# Patient Record
Sex: Male | Born: 1940 | Smoking: Former smoker
Health system: Southern US, Community
[De-identification: ages and names within clinical notes are randomized; demographics above are authoritative.]

## PROBLEM LIST (undated history)

## (undated) DIAGNOSIS — N400 Enlarged prostate without lower urinary tract symptoms: Secondary | ICD-10-CM

## (undated) DIAGNOSIS — E039 Hypothyroidism, unspecified: Secondary | ICD-10-CM

## (undated) DIAGNOSIS — I639 Cerebral infarction, unspecified: Secondary | ICD-10-CM

## (undated) DIAGNOSIS — E119 Type 2 diabetes mellitus without complications: Secondary | ICD-10-CM

## (undated) DIAGNOSIS — I509 Heart failure, unspecified: Secondary | ICD-10-CM

## (undated) DIAGNOSIS — R569 Unspecified convulsions: Secondary | ICD-10-CM

## (undated) DIAGNOSIS — J9620 Acute and chronic respiratory failure, unspecified whether with hypoxia or hypercapnia: Secondary | ICD-10-CM

## (undated) DIAGNOSIS — F419 Anxiety disorder, unspecified: Secondary | ICD-10-CM

## (undated) DIAGNOSIS — J189 Pneumonia, unspecified organism: Secondary | ICD-10-CM

## (undated) DIAGNOSIS — F319 Bipolar disorder, unspecified: Secondary | ICD-10-CM

## (undated) DIAGNOSIS — M109 Gout, unspecified: Secondary | ICD-10-CM

## (undated) DIAGNOSIS — I10 Essential (primary) hypertension: Secondary | ICD-10-CM

## (undated) DIAGNOSIS — H9190 Unspecified hearing loss, unspecified ear: Secondary | ICD-10-CM

## (undated) DIAGNOSIS — I4891 Unspecified atrial fibrillation: Secondary | ICD-10-CM

## (undated) DIAGNOSIS — J449 Chronic obstructive pulmonary disease, unspecified: Secondary | ICD-10-CM

## (undated) HISTORY — PX: CHOLECYSTECTOMY: SHX55

## (undated) HISTORY — DX: Cerebral infarction, unspecified: I63.9

## (undated) HISTORY — PX: LUMBAR LAMINECTOMY: SHX95

## (undated) HISTORY — PX: NEPHROLITHOTOMY: SUR881

---

## 2020-03-20 LAB — HEMOGLOBIN A1C: Hemoglobin A1C: 8.3

## 2020-03-20 LAB — TSH: TSH: 1.7 (ref 0.41–5.90)

## 2020-03-20 LAB — VITAMIN D 25 HYDROXY (VIT D DEFICIENCY, FRACTURES): Vit D, 25-Hydroxy: 44.6

## 2020-04-19 ENCOUNTER — Emergency Department (HOSPITAL_COMMUNITY): Payer: Medicare Other

## 2020-04-19 ENCOUNTER — Emergency Department (HOSPITAL_COMMUNITY)
Admission: EM | Admit: 2020-04-19 | Discharge: 2020-04-19 | Disposition: A | Discharge status: Home / Self Care | Payer: Medicare Other | Attending: Emergency Medicine | Admitting: Emergency Medicine

## 2020-04-19 ENCOUNTER — Encounter (HOSPITAL_COMMUNITY): Payer: Self-pay | Admitting: *Deleted

## 2020-04-19 DIAGNOSIS — I509 Heart failure, unspecified: Secondary | ICD-10-CM | POA: Insufficient documentation

## 2020-04-19 DIAGNOSIS — Z7901 Long term (current) use of anticoagulants: Secondary | ICD-10-CM | POA: Diagnosis not present

## 2020-04-19 DIAGNOSIS — I110 Hypertensive heart disease with heart failure: Secondary | ICD-10-CM | POA: Diagnosis not present

## 2020-04-19 DIAGNOSIS — Z7982 Long term (current) use of aspirin: Secondary | ICD-10-CM | POA: Diagnosis not present

## 2020-04-19 DIAGNOSIS — Z7951 Long term (current) use of inhaled steroids: Secondary | ICD-10-CM | POA: Insufficient documentation

## 2020-04-19 DIAGNOSIS — I4891 Unspecified atrial fibrillation: Secondary | ICD-10-CM | POA: Diagnosis not present

## 2020-04-19 DIAGNOSIS — Z794 Long term (current) use of insulin: Secondary | ICD-10-CM | POA: Insufficient documentation

## 2020-04-19 DIAGNOSIS — R0602 Shortness of breath: Secondary | ICD-10-CM | POA: Diagnosis present

## 2020-04-19 DIAGNOSIS — E119 Type 2 diabetes mellitus without complications: Secondary | ICD-10-CM | POA: Diagnosis not present

## 2020-04-19 DIAGNOSIS — R079 Chest pain, unspecified: Secondary | ICD-10-CM | POA: Diagnosis not present

## 2020-04-19 DIAGNOSIS — J449 Chronic obstructive pulmonary disease, unspecified: Secondary | ICD-10-CM | POA: Diagnosis not present

## 2020-04-19 DIAGNOSIS — Z79899 Other long term (current) drug therapy: Secondary | ICD-10-CM | POA: Insufficient documentation

## 2020-04-19 DIAGNOSIS — E039 Hypothyroidism, unspecified: Secondary | ICD-10-CM | POA: Diagnosis not present

## 2020-04-19 HISTORY — DX: Acute and chronic respiratory failure, unspecified whether with hypoxia or hypercapnia: J96.20

## 2020-04-19 HISTORY — DX: Pneumonia, unspecified organism: J18.9

## 2020-04-19 HISTORY — DX: Unspecified atrial fibrillation: I48.91

## 2020-04-19 HISTORY — DX: Chronic obstructive pulmonary disease, unspecified: J44.9

## 2020-04-19 HISTORY — DX: Hypothyroidism, unspecified: E03.9

## 2020-04-19 HISTORY — DX: Type 2 diabetes mellitus without complications: E11.9

## 2020-04-19 HISTORY — DX: Heart failure, unspecified: I50.9

## 2020-04-19 HISTORY — DX: Essential (primary) hypertension: I10

## 2020-04-19 LAB — URINALYSIS, ROUTINE W REFLEX MICROSCOPIC
Bilirubin Urine: NEGATIVE
Glucose, UA: NEGATIVE mg/dL
Hgb urine dipstick: NEGATIVE
Ketones, ur: NEGATIVE mg/dL
Leukocytes,Ua: NEGATIVE
Nitrite: NEGATIVE
Protein, ur: NEGATIVE mg/dL
Specific Gravity, Urine: 1.008 (ref 1.005–1.030)
pH: 7 (ref 5.0–8.0)

## 2020-04-19 LAB — CBC
HCT: 44.8 % (ref 39.0–52.0)
Hemoglobin: 14.7 g/dL (ref 13.0–17.0)
MCH: 30.2 pg (ref 26.0–34.0)
MCHC: 32.8 g/dL (ref 30.0–36.0)
MCV: 92 fL (ref 80.0–100.0)
Platelets: 88 10*3/uL — ABNORMAL LOW (ref 150–400)
RBC: 4.87 MIL/uL (ref 4.22–5.81)
RDW: 13.9 % (ref 11.5–15.5)
WBC: 4.4 10*3/uL (ref 4.0–10.5)
nRBC: 0 % (ref 0.0–0.2)

## 2020-04-19 LAB — BASIC METABOLIC PANEL
Anion gap: 10 (ref 5–15)
BUN: 14 mg/dL (ref 8–23)
CO2: 30 mmol/L (ref 22–32)
Calcium: 9.1 mg/dL (ref 8.9–10.3)
Chloride: 98 mmol/L (ref 98–111)
Creatinine, Ser: 0.82 mg/dL (ref 0.61–1.24)
GFR calc Af Amer: >60 mL/min (ref >60)
GFR calc non Af Amer: >60 mL/min (ref >60)
Glucose, Bld: 165 mg/dL — ABNORMAL HIGH (ref 70–99)
Potassium: 3.8 mmol/L (ref 3.5–5.1)
Sodium: 138 mmol/L (ref 135–145)

## 2020-04-19 LAB — BRAIN NATRIURETIC PEPTIDE: B Natriuretic Peptide: 61 pg/mL (ref 0.0–100.0)

## 2020-04-19 LAB — TROPONIN I (HIGH SENSITIVITY)
Troponin I (High Sensitivity): 16 ng/L (ref <18)
Troponin I (High Sensitivity): 17 ng/L (ref <18)

## 2020-04-19 MED ORDER — ASPIRIN 81 MG PO CHEW
324.0000 mg | CHEWABLE_TABLET | Freq: Once | ORAL | Status: AC
  Administered 2020-04-19: 324 mg via ORAL
  Filled 2020-04-19: qty 4

## 2020-04-19 MED ORDER — SODIUM CHLORIDE 0.9% FLUSH: 3.0000 mL | Freq: Once | INTRAVENOUS | Status: DC

## 2020-04-19 NOTE — Discharge Instructions (Addendum)
Your exam, lab tests, xray and ekg are all stable tonight with no source of your symptoms found.  Since your symptoms are resolved, you may return home tonight. However,  I recommend close followup with your primary doctor for a recheck next week, especially if you have any new or return of these symptoms.  I also recommend recheck here if your symptoms worsen and you are unable to get in with your doctor.

## 2020-04-19 NOTE — ED Notes (Signed)
Pt incontinent of urine. Pt cleaned, linens changed and primo fit placed. Pt repositioned.

## 2020-04-19 NOTE — ED Triage Notes (Signed)
Pt reports he has a hx of having 2 heart attacks in the past

## 2020-04-19 NOTE — ED Notes (Signed)
Patient transported to X-ray 

## 2020-04-19 NOTE — ED Notes (Signed)
Pt put into gown due to wet pants and a clean brief put onto pt.

## 2020-04-19 NOTE — ED Notes (Signed)
Pt is on male purewick. Pt told to call when urine sample is ready.

## 2020-04-19 NOTE — ED Provider Notes (Signed)
[facility] EMERGENCY DEPARTMENT Provider Note   CSN: [id number] Arrival date & time: 04/19/20  1717     History Chief Complaint  Patient presents with  . Shortness of Breath    John Kirk is a 79 y.o. male with a history of respiratory failure, atrial fibrillation, CHF, COPD, hypertension, type 2 diabetes and history of pneumonia presenting with a 1 day history of shortness of breath and left upper chest pressure which started early this morning.  He also endorses increased lower extremity edema.  He has had no fevers or chills, he does endorse a nonproductive cough, and states he has pain at his left rib cage region which is worsened with coughing.  He does use as needed oxygen, mostly at nighttime.  He presents from a local assisted living facility, is originally from [facility].   HPI  HPI: A 79 year old patient with a history of treated diabetes, hypertension and obesity presents for evaluation of chest pain. Initial onset of pain was more than 6 hours ago. The patient's chest pain is described as heaviness/pressure/tightness and is not worse with exertion. The patient's chest pain is middle- or left-sided, is not well-localized, is not sharp and does not radiate to the arms/jaw/neck. The patient does not complain of nausea and denies diaphoresis. The patient has no history of stroke, has no history of peripheral artery disease, has not smoked in the past 90 days, has no relevant family history of coronary artery disease (first degree relative at less than age 7) and has no history of hypercholesterolemia.   Past Medical History:  Diagnosis Date  . 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 Atrial fibrillation (HCC)   . CHF (congestive heart failure) (HCC)   . COPD (chronic obstructive pulmonary disease) (HCC)   . DM2 (diabetes mellitus, type 2) (HCC)   . Hypertension   . Hypothyroidism   . Pneumonia     There are no problems to display for this  patient.   History reviewed. No pertinent surgical history.     History reviewed. No pertinent family history.  Social History   Tobacco Use  . Smoking status: Unknown If Ever Smoked  Substance Use Topics  . Alcohol use: Not on file  . Drug use: Not on file    Home Medications Prior to Admission medications   Medication Sig Start Date End Date Taking? Authorizing Provider  acetaminophen (TYLENOL) 500 MG tablet Take 1,000 mg by mouth in the morning.   Yes [provider]  acetaminophen (TYLENOL) 500 MG tablet Take 1,000 mg by mouth every 6 (six) hours as needed for mild pain or moderate pain.   Yes [provider]  albuterol (VENTOLIN HFA) 108 (90 Base) MCG/ACT inhaler Inhale 2 puffs into the lungs in the morning, at noon, and at bedtime. *May use every 4 hours as needed for wheezing   Yes [provider]  allopurinol (ZYLOPRIM) 100 MG tablet Take 100 mg by mouth daily.   Yes [provider]  apixaban (ELIQUIS) 5 MG TABS tablet Take 5 mg by mouth 2 (two) times daily.   Yes [provider]  ARIPiprazole (ABILIFY) 2 MG tablet Take 2 mg by mouth in the morning.   Yes [provider]  aspirin EC 81 MG tablet Take 81 mg by mouth in the morning. Swallow whole.   Yes [provider]  bisacodyl (DULCOLAX) 10 MG suppository Place 10 mg rectally daily as needed for moderate constipation.  Yes [provider]  divalproex (DEPAKOTE) 125 MG DR tablet Take 125 mg by mouth at bedtime.   Yes [provider]  DULoxetine (CYMBALTA) 30 MG capsule Take 30 mg by mouth daily at 12 noon.   Yes [provider]  ergocalciferol (VITAMIN D2) 1.25 MG (50000 UT) capsule Take 50,000 Units by mouth once a week.   Yes [provider]  furosemide (LASIX) 20 MG tablet Take 20 mg by mouth in the morning.   Yes [provider]  gabapentin (NEURONTIN) 100 MG capsule Take 100 mg by mouth in the morning and at  bedtime.   Yes [provider]  insulin aspart (NOVOLOG FLEXPEN) 100 UNIT/ML FlexPen Inject 21 Units into the skin in the morning.   Yes [provider]  insulin detemir (LEVEMIR FLEXTOUCH) 100 UNIT/ML FlexPen Inject 46 Units into the skin every 12 (twelve) hours.   Yes [provider]  isosorbide mononitrate (IMDUR) 30 MG 24 hr tablet Take 30 mg by mouth in the morning.   Yes [provider]  levETIRAcetam (KEPPRA) 500 MG tablet Take 500 mg by mouth 2 (two) times daily.   Yes [provider]  levothyroxine (SYNTHROID) 125 MCG tablet Take 125 mcg by mouth daily. 04/11/20  Yes [provider]  magnesium oxide (MAG-OX) 400 MG tablet Take 400 mg by mouth in the morning and at bedtime.   Yes [provider]  nitroGLYCERIN (NITROSTAT) 0.4 MG SL tablet Place 0.4 mg under the tongue every 5 (five) minutes as needed for chest pain.   Yes [provider]  Olopatadine HCl (PATADAY) 0.2 % SOLN Place 2 drops into both eyes daily.   Yes [provider]  polyethylene glycol (MIRALAX / GLYCOLAX) 17 g packet Take 17 g by mouth daily. *Mixed in 4-8 ounces of water-drink   Yes [provider]  rosuvastatin (CRESTOR) 10 MG tablet Take 10 mg by mouth in the morning.   Yes [provider]  senna-docusate (SENEXON-S) 8.6-50 MG tablet Take 2 tablets by mouth in the morning.   Yes [provider]  simethicone (MYLICON) 80 MG chewable tablet Chew 80 mg by mouth in the morning.   Yes [provider]  tamsulosin (FLOMAX) 0.4 MG CAPS capsule Take 0.4 mg by mouth in the morning. *Give after the same meal   Yes [provider]    Allergies    Celery oil and Morphine and related  Review of Systems   Review of Systems  Constitutional: Negative for chills and fever.  HENT: Negative for congestion and sore throat.   Eyes: Negative.   Respiratory: Positive for cough and shortness of breath. Negative  for chest tightness and wheezing.   Cardiovascular: Positive for chest pain and leg swelling.  Gastrointestinal: Negative for abdominal pain, nausea and vomiting.  Genitourinary: Negative.   Musculoskeletal: Negative for arthralgias, joint swelling and neck pain.  Skin: Negative.  Negative for rash and wound.  Neurological: Negative for dizziness, weakness, light-headedness, numbness and headaches.  Psychiatric/Behavioral: Negative.     Physical Exam Updated Vital Signs BP 118/77   Pulse (!) 52   Temp 97.7 F (36.5 C) (Oral)   Resp 17   Ht 5\' 9"  (1.753 m)   Wt 106.6 kg   SpO2 95%   BMI 34.70 kg/m   Physical Exam Vitals and nursing note reviewed.  Constitutional:      Appearance: He is well-developed.  HENT:     Head: Normocephalic and atraumatic.  Comments: Hard of hearing Eyes:     Conjunctiva/sclera: Conjunctivae normal.  Cardiovascular:     Rate and Rhythm: Normal rate and regular rhythm.     Heart sounds: Normal heart sounds.  Pulmonary:     Effort: Pulmonary effort is normal.     Breath sounds: Examination of the right-lower field reveals rales. Examination of the left-lower field reveals rales. Rales present. No wheezing.     Comments: Trace bilateral rales. Abdominal:     General: Bowel sounds are normal.     Palpations: Abdomen is soft.     Tenderness: There is no abdominal tenderness.  Musculoskeletal:        General: Normal range of motion.     Cervical back: Normal range of motion.     Right lower leg: No tenderness. Edema present.     Left lower leg: No tenderness. Edema present.  Skin:    General: Skin is warm and dry.  Neurological:     Mental Status: He is alert.     ED Results / Procedures / Treatments   Labs (all labs ordered are listed, but only abnormal results are displayed) Labs Reviewed  BASIC METABOLIC PANEL - Abnormal; Notable for the following components:      Result Value   Glucose, Bld 165 (*)    All other components within  normal limits  CBC - Abnormal; Notable for the following components:   Platelets 88 (*)    All other components within normal limits  BRAIN NATRIURETIC PEPTIDE  URINALYSIS, ROUTINE W REFLEX MICROSCOPIC  TROPONIN I (HIGH SENSITIVITY)  TROPONIN I (HIGH SENSITIVITY)    EKG EKG Interpretation  Date/Time:  Friday April 19 2020 17:34:01 EDT Ventricular Rate:  59 PR Interval:    QRS Duration: 90 QT Interval:  420 QTC Calculation: 415 R Axis:   107 Text Interpretation: Atrial fibrillation with slow ventricular response Rightward axis Abnormal ECG No previous ECGs available Confirmed by Kennis Carina [phone]) on 04/19/2020 6:36:08 PM   Radiology DG Chest 2 View  Result Date: 04/19/2020 CLINICAL DATA:  Chest pain. Shortness of breath. EXAM: CHEST - 2 VIEW COMPARISON:  Radiograph 03/20/2020 FINDINGS: Unchanged cardiomegaly. Unchanged mediastinal contours. No pulmonary edema, confluent airspace disease, pleural effusion or pneumothorax. Mild degenerative change in the spine. IMPRESSION: Stable cardiomegaly without acute findings. Electronically Signed   By: Narda Rutherford M.D.   On: 04/19/2020 18:18    Procedures Procedures (including critical care time)  Medications Ordered in ED Medications  sodium chloride flush (NS) 0.9 % injection 3 mL (has no administration in time range)  aspirin chewable tablet 324 mg (324 mg Oral Given 04/19/20 1932)    ED Course  I have reviewed the triage vital signs and the nursing notes.  Pertinent labs & imaging results that were available during my care of the patient were reviewed by me and considered in my medical decision making (see chart for details).    MDM Rules/Calculators/A&P HEAR Score: 5                        Patient's labs, imaging and EKG reviewed and discussed with patient.  His EKG is stable, atrial fibrillation which is known to this patient. Heart score of 5.  Delta troponins are negative, BNP and chest x-ray are also normal with no  evidence for CHF.  He does have some trace ankle edema, is currently on Lasix.  He was symptom-free early in his ED stay.  With  no acute findings and currently no symptoms, patient was felt safe for return home to his assisted living facility.  He was advised to close follow-up with his PCP next week.  Also return precautions were outlined.  As needed follow-up anticipated.  Pt seen by Dr. Pilar PlateBero during admission.   Final Clinical Impression(s) / ED Diagnoses Final diagnoses:  Shortness of breath  Chest pain, unspecified type    Rx / DC Orders ED Discharge Orders    None       Victoriano Laindol, Selestino Nila, PA-C 04/19/20 2308    Sabas SousBero, Michael M, MD 04/20/20 0028

## 2020-04-19 NOTE — ED Notes (Signed)
Please contact the patient POA, Juliette Alcide, with plan for disposition. [phone]. She was updated on pt current status

## 2020-04-19 NOTE — ED Notes (Signed)
Pt given food tray and diet ginger-ale per request

## 2020-04-19 NOTE — ED Notes (Signed)
Attempted to call melinda x 2, left message for the same. Called [facility] facility, gave them d/c instructions. They will try to contact their administrator to set up transport.

## 2020-04-19 NOTE — ED Triage Notes (Signed)
Patient presents to the ED from [facility] after evaluation from PCP at the home for shortness of breath and chest pain for today.

## 2020-04-19 NOTE — ED Notes (Signed)
Facility called, requested EMS transport for d/c

## 2020-06-01 LAB — COMPREHENSIVE METABOLIC PANEL
GFR calc Af Amer: 97
GFR calc non Af Amer: 80

## 2020-06-01 LAB — BASIC METABOLIC PANEL
BUN: 13 (ref 4–21)
Creatinine: 1 (ref 0.6–1.3)

## 2020-06-27 NOTE — Progress Notes (Signed)
Subjective: 1. Urinary incontinence, unspecified type   2. BPH with urinary obstruction   3. Nocturia      John Kirk is a 79 yo male who presents for evaluation of urinary incontinence which has been chronic.  He is wearing pull-ups which he changed 2-3x daily.  He will go and void but has urgency and he will leak with walking.  He has frequency more often that q1hr.  He has nocturnal incontinence that can soak the bed through the diaper.  He has some dysuria.   He has a history of stones and a left nephrolithotomy. He has a history of BPH with BOO  and has had no prostate surgery but is on tamsulosin.    ROS:  Review of Systems  Gastrointestinal: Positive for constipation.  Neurological: Positive for sensory change (Hands and face) and weakness (can't stand).    Allergies  Allergen Reactions   Celery Oil     Unknown-listed on MAR provided by facility   Morphine And Related     Unknown-listed on MAR provided by facility    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) (HCC)    Atrial fibrillation (HCC)    CHF (congestive heart failure) (HCC)    COPD (chronic obstructive pulmonary disease) (HCC)    CVA (cerebral vascular accident) (HCC)    DM2 (diabetes mellitus, type 2) (HCC)    Hypertension    Hypothyroidism    Pneumonia     Past Surgical History:  Procedure Laterality Date   CHOLECYSTECTOMY     LUMBAR LAMINECTOMY     x 2   NEPHROLITHOTOMY Right     Social History   Socioeconomic History   Marital status: Widowed    Spouse name: Not on file   Number of children: 1   Years of education: Not on file   Highest education level: Not on file  Occupational History   Not on file  Tobacco Use   Smoking status: Former Smoker    Years: 20.00   Smokeless tobacco: Former Neurosurgeon    Types: Chew   Tobacco comment: smoked from 20yr to 26 yr  Substance and Sexual Activity   Alcohol use: Not on file    Drug use: Not on file   Sexual activity: Not on file  Other Topics Concern   Not on file  Social History Narrative   Not on file   Social Determinants of Health   Financial Resource Strain:    Difficulty of Paying Living Expenses: Not on file  Food Insecurity:    Worried About Programme researcher, broadcasting/film/video in the Last Year: Not on file   [employer] of Food in the Last Year: Not on file  Transportation Needs:    Lack of Transportation (Medical): Not on file   Lack of Transportation (Non-Medical): Not on file  Physical Activity:    Days of Exercise per Week: Not on file   Minutes of Exercise per Session: Not on file  Stress:    Feeling of Stress : Not on file  Social Connections:    Frequency of Communication with Friends and Family: Not on file   Frequency of Social Gatherings with Friends and Family: Not on file   Attends Religious Services: Not on file   Active Member of Clubs or Organizations: Not on file   Attends Banker Meetings: Not on file   Marital Status: Not on file  Intimate Partner Violence:  Fear of Current or Ex-Partner: Not on file   Emotionally Abused: Not on file   Physically Abused: Not on file   Sexually Abused: Not on file    History reviewed. No pertinent family history.  Anti-infectives: Anti-infectives (From admission, onward)   None      Current Outpatient Medications  Medication Sig Dispense Refill   acetaminophen (TYLENOL) 500 MG tablet Take 1,000 mg by mouth in the morning.     acetaminophen (TYLENOL) 500 MG tablet Take 1,000 mg by mouth every 6 (six) hours as needed for mild pain or moderate pain.     albuterol (VENTOLIN HFA) 108 (90 Base) MCG/ACT inhaler Inhale 2 puffs into the lungs in the morning, at noon, and at bedtime. *May use every 4 hours as needed for wheezing     allopurinol (ZYLOPRIM) 100 MG tablet Take 100 mg by mouth daily.     ALPRAZolam (XANAX) 0.25 MG tablet Take 0.25 mg by mouth 2 (two) times  daily as needed.     apixaban (ELIQUIS) 5 MG TABS tablet Take 5 mg by mouth 2 (two) times daily.     ARIPiprazole (ABILIFY) 2 MG tablet Take 2 mg by mouth in the morning.     aspirin EC 81 MG tablet Take 81 mg by mouth in the morning. Swallow whole.     BD AUTOSHIELD DUO 30G X 5 MM MISC      bisacodyl (DULCOLAX) 10 MG suppository Place 10 mg rectally daily as needed for moderate constipation.     divalproex (DEPAKOTE) 125 MG DR tablet Take 125 mg by mouth at bedtime.     DULoxetine (CYMBALTA) 30 MG capsule Take 30 mg by mouth daily at 12 noon.     DULoxetine (CYMBALTA) 60 MG capsule Take 60 mg by mouth daily.     ergocalciferol (VITAMIN D2) 1.25 MG (50000 UT) capsule Take 50,000 Units by mouth once a week.     furosemide (LASIX) 20 MG tablet Take 20 mg by mouth in the morning.     gabapentin (NEURONTIN) 100 MG capsule Take 100 mg by mouth in the morning and at bedtime.     insulin aspart (NOVOLOG FLEXPEN) 100 UNIT/ML FlexPen Inject 21 Units into the skin in the morning.     insulin detemir (LEVEMIR FLEXTOUCH) 100 UNIT/ML FlexPen Inject 46 Units into the skin every 12 (twelve) hours.     isosorbide mononitrate (IMDUR) 30 MG 24 hr tablet Take 30 mg by mouth in the morning.     ketoconazole (NIZORAL) 2 % shampoo Apply 1 application topically 2 (two) times a week.     levETIRAcetam (KEPPRA) 500 MG tablet Take 500 mg by mouth 2 (two) times daily.     levothyroxine (SYNTHROID) 125 MCG tablet Take 125 mcg by mouth daily.     lisinopril (ZESTRIL) 10 MG tablet Take 10 mg by mouth daily.     magnesium oxide (MAG-OX) 400 MG tablet Take 400 mg by mouth in the morning and at bedtime.     nitroGLYCERIN (NITROSTAT) 0.4 MG SL tablet Place 0.4 mg under the tongue every 5 (five) minutes as needed for chest pain.     Olopatadine HCl (PATADAY) 0.2 % SOLN Place 2 drops into both eyes daily.     ONETOUCH ULTRA test strip 3 (three) times daily.     polyethylene glycol (MIRALAX / GLYCOLAX) 17 g  packet Take 17 g by mouth daily. *Mixed in 4-8 ounces of water-drink     rosuvastatin (CRESTOR) 10  MG tablet Take 10 mg by mouth in the morning.     senna-docusate (SENEXON-S) 8.6-50 MG tablet Take 2 tablets by mouth in the morning.     simethicone (MYLICON) 80 MG chewable tablet Chew 80 mg by mouth in the morning.     tamsulosin (FLOMAX) 0.4 MG CAPS capsule Take 0.4 mg by mouth in the morning. *Give 30min after the same meal     mirabegron ER (MYRBETRIQ) 50 MG TB24 tablet Take 1 tablet (50 mg total) by mouth daily. 28 tablet 0   No current facility-administered medications for this visit.     Objective: Vital signs in last 24 hours: BP (!) 144/75    Pulse 65    Temp 97.8 F (36.6 C)    Ht 5\' 9"  (1.753 m)    Wt 235 lb (106.6 kg)    BMI 34.70 kg/m   Intake/Output from previous day: No intake/output data recorded. Intake/Output this shift: @IOTHISSHIFT @   Physical Exam Constitutional:      Appearance: Normal appearance. He is obese.     Comments: In a wheelchair.   Cardiovascular:     Rate and Rhythm: Normal rate and regular rhythm.  Pulmonary:     Effort: Pulmonary effort is normal. No respiratory distress.     Breath sounds: Normal breath sounds.  Abdominal:     Palpations: Abdomen is soft.     Tenderness: There is no abdominal tenderness.     Hernia: A hernia (umbilical) is present.  Genitourinary:    Comments: Nl phallus with adequate meatus. Scrotum and contents normal AP NST without mass. Prostate 2.5+ smooth and firm. SV Non-palpable.  Musculoskeletal:        General: Normal range of motion.     Right lower leg: Edema present.     Left lower leg: Edema present.  Skin:    General: Skin is warm and dry.  Neurological:     General: No focal deficit present.     Mental Status: He is oriented to person, place, and time.     Motor: Weakness (Needs assistance to stand. ) present.  Psychiatric:        Mood and Affect: Mood normal.        Behavior: Behavior  normal.     Lab Results:  Results for orders placed or performed in visit on 06/28/20 (from the past 24 hour(s))  Urinalysis, Routine w reflex microscopic     Status: Abnormal   Collection Time: 06/28/20  9:18 AM  Result Value Ref Range   Specific Gravity, UA 1.020 1.005 - 1.030   pH, UA 6.0 5.0 - 7.5   Color, UA Yellow Yellow   Appearance Ur Clear Clear   Leukocytes,UA Negative Negative   Protein,UA Negative Negative/Trace   Glucose, UA Trace (A) Negative   Ketones, UA Negative Negative   RBC, UA Trace (A) Negative   Bilirubin, UA Negative Negative   Urobilinogen, Ur 0.2 0.2 - 1.0 mg/dL   Nitrite, UA Negative Negative   Microscopic Examination See below:    Narrative   Performed at:  [address] - [facility] [address], [place], [place]  [id number] Lab Director: Chinita PesterLorie South [place], Phone:  [phone]  Microscopic Examination     Status: None   Collection Time: 06/28/20  9:18 AM   Urine  Result Value Ref Range   WBC, UA None seen 0 - 5 /hpf   RBC 0-2 0 - 2 /hpf   Epithelial Cells (non renal) None seen  0 - 10 /hpf   Renal Epithel, UA None seen None seen /hpf   Bacteria, UA None seen None seen/Few   Narrative   Performed at:  [address] - [facility] [address] [place], [place]  [id number] Lab Director: Chinita Pester [place], Phone:  [phone]    BMET No results for input(s): NA, K, CL, CO2, GLUCOSE, BUN, CREATININE, CALCIUM in the last 72 hours. PT/INR No results for input(s): LABPROT, INR in the last 72 hours. ABG No results for input(s): PHART, HCO3 in the last 72 hours.  Invalid input(s): PCO2, PO2  Studies/Results: BMP and CBC unremarkable in 7/21 and UA clear.   Cr 0.82  Glu 165.   Assessment/Plan: OAB wet:  He is emptying well with a PVR of only 6ml and I would like to see if we can avoid a foley.  I am going to try him on Myrbetriq first and if that fails we can consider other options.   Side effects and drug interactions reviewed.   Meds  ordered this encounter  Medications   mirabegron ER (MYRBETRIQ) 50 MG TB24 tablet    Sig: Take 1 tablet (50 mg total) by mouth daily.    Dispense:  28 tablet    Refill:  0     Orders Placed This Encounter  Procedures   Microscopic Examination   Urinalysis, Routine w reflex microscopic   Bladder scan     Return for 4-6 weeks for PVR.    CC: [employer].  Gwenevere Ghazi NP with [facility].    John Kirk 06/28/2020 [phone]

## 2020-06-28 ENCOUNTER — Other Ambulatory Visit: Payer: Self-pay

## 2020-06-28 ENCOUNTER — Ambulatory Visit (INDEPENDENT_AMBULATORY_CARE_PROVIDER_SITE_OTHER): Payer: Medicare Other | Admitting: Urology

## 2020-06-28 ENCOUNTER — Encounter: Payer: Self-pay | Admitting: Urology

## 2020-06-28 VITALS — BP 144/75 | HR 65 | Temp 97.8°F | Ht 69.0 in | Wt 235.0 lb

## 2020-06-28 DIAGNOSIS — R32 Unspecified urinary incontinence: Secondary | ICD-10-CM

## 2020-06-28 DIAGNOSIS — N138 Other obstructive and reflux uropathy: Secondary | ICD-10-CM | POA: Diagnosis not present

## 2020-06-28 DIAGNOSIS — N401 Enlarged prostate with lower urinary tract symptoms: Secondary | ICD-10-CM | POA: Diagnosis not present

## 2020-06-28 DIAGNOSIS — R351 Nocturia: Secondary | ICD-10-CM | POA: Diagnosis not present

## 2020-06-28 LAB — BLADDER SCAN: Scan Result: 94

## 2020-06-28 LAB — URINALYSIS, ROUTINE W REFLEX MICROSCOPIC
Bilirubin, UA: NEGATIVE
Ketones, UA: NEGATIVE
Leukocytes,UA: NEGATIVE
Nitrite, UA: NEGATIVE
Protein,UA: NEGATIVE
Specific Gravity, UA: 1.02 (ref 1.005–1.030)
Urobilinogen, Ur: 0.2 mg/dL (ref 0.2–1.0)
pH, UA: 6 (ref 5.0–7.5)

## 2020-06-28 LAB — MICROSCOPIC EXAMINATION
Bacteria, UA: NONE SEEN
Epithelial Cells (non renal): NONE SEEN /hpf (ref 0–10)
Renal Epithel, UA: NONE SEEN /hpf
WBC, UA: NONE SEEN /hpf (ref 0–5)

## 2020-06-28 MED ORDER — MIRABEGRON ER 50 MG PO TB24: 50.0000 mg | ORAL_TABLET | Freq: Every day | ORAL | 0 refills | Status: AC

## 2020-06-28 NOTE — Progress Notes (Signed)
PVR = 97   Urological Symptom Review  Patient is experiencing the following symptoms: Frequent urination Burning/pain with urination Get up at night to urinate Leakage of urine Stream starts and stops Urinary tract infection Weak stream   Review of Systems  Gastrointestinal (upper)  : Negative for upper GI symptoms  Gastrointestinal (lower) : Negative for lower GI symptoms  Constitutional : Negative for symptoms  Skin: Negative for skin symptoms  Eyes: Negative for eye symptoms  Ear/Nose/Throat : Negative for Ear/Nose/Throat symptoms  Hematologic/Lymphatic: Negative for Hematologic/Lymphatic symptoms  Cardiovascular : Negative for cardiovascular symptoms  Respiratory : Negative for respiratory symptoms  Endocrine: Negative for endocrine symptoms  Musculoskeletal: Negative for musculoskeletal symptoms  Neurological: Negative for neurological symptoms  Psychologic: Negative for psychiatric symptoms

## 2020-08-01 ENCOUNTER — Ambulatory Visit (INDEPENDENT_AMBULATORY_CARE_PROVIDER_SITE_OTHER): Payer: Medicare Other | Admitting: "Endocrinology

## 2020-08-01 ENCOUNTER — Encounter: Payer: Self-pay | Admitting: "Endocrinology

## 2020-08-01 ENCOUNTER — Other Ambulatory Visit: Payer: Self-pay

## 2020-08-01 VITALS — BP 142/62 | HR 60 | Ht 69.0 in | Wt 247.8 lb

## 2020-08-01 DIAGNOSIS — E119 Type 2 diabetes mellitus without complications: Secondary | ICD-10-CM | POA: Insufficient documentation

## 2020-08-01 DIAGNOSIS — Z794 Long term (current) use of insulin: Secondary | ICD-10-CM | POA: Diagnosis not present

## 2020-08-01 DIAGNOSIS — E039 Hypothyroidism, unspecified: Secondary | ICD-10-CM

## 2020-08-01 LAB — POCT GLYCOSYLATED HEMOGLOBIN (HGB A1C): Hemoglobin A1C: 8.8 % — AB (ref 4.0–5.6)

## 2020-08-01 NOTE — Progress Notes (Signed)
Endocrinology Consult Note       08/01/2020, 3:27 PM   Subjective:    Patient ID: John Kirk, male    DOB: 1941/04/23.  John Kirk is being seen in consultation for management of currently uncontrolled symptomatic diabetes requested by  Dettinger, Elige Radon, MD.   Past Medical History:  Diagnosis Date  . 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 Atrial fibrillation (HCC)   . CHF (congestive heart failure) (HCC)   . COPD (chronic obstructive pulmonary disease) (HCC)   . CVA (cerebral vascular accident) (HCC)   . DM2 (diabetes mellitus, type 2) (HCC)   . Hypertension   . Hypothyroidism   . Pneumonia     Past Surgical History:  Procedure Laterality Date  . CHOLECYSTECTOMY    . LUMBAR LAMINECTOMY     x 2  . NEPHROLITHOTOMY Right     Social History   Socioeconomic History  . Marital status: Widowed    Spouse name: Not on file  . Number of children: 1  . Years of education: Not on file  . Highest education level: Not on file  Occupational History  . Not on file  Tobacco Use  . Smoking status: Former Smoker    Years: 20.00  . Smokeless tobacco: Former Neurosurgeon    Types: Chew  . Tobacco comment: smoked from 81yr to 26 yr  Vaping Use  . Vaping Use: Never used  Substance and Sexual Activity  . Alcohol use: Never  . Drug use: Never  . Sexual activity: Not on file  Other Topics Concern  . Not on file  Social History Narrative  . Not on file   Social Determinants of Health   Financial Resource Strain:   . Difficulty of Paying Living Expenses: Not on file  Food Insecurity:   . Worried About Programme researcher, broadcasting/film/video in the Last Year: Not on file  . Ran Out of Food in the Last Year: Not on file  Transportation Needs:   . Lack of Transportation (Medical): Not on file  . Lack of Transportation (Non-Medical): Not on file  Physical Activity:   . Days of Exercise per  Week: Not on file  . Minutes of Exercise per Session: Not on file  Stress:   . Feeling of Stress : Not on file  Social Connections:   . Frequency of Communication with Friends and Family: Not on file  . Frequency of Social Gatherings with Friends and Family: Not on file  . Attends Religious Services: Not on file  . Active Member of Clubs or Organizations: Not on file  . Attends Banker Meetings: Not on file  . Marital Status: Not on file    Family History  Problem Relation Age of Onset  . Diabetes Mother   . Thyroid disease Mother   . Hypertension Mother   . Hyperlipidemia Mother   . Heart attack Mother   . Cancer Mother   . Hypertension Father   . Cancer Father     Outpatient Encounter Medications as of 08/01/2020  Medication Sig  . Propylene Glycol (SYSTANE COMPLETE) 0.6 %  SOLN Apply to eye.  Marland Kitchen acetaminophen (TYLENOL) 500 MG tablet Take 1,000 mg by mouth in the morning.  Marland Kitchen acetaminophen (TYLENOL) 500 MG tablet Take 1,000 mg by mouth every 6 (six) hours as needed for mild pain or moderate pain.  Marland Kitchen albuterol (VENTOLIN HFA) 108 (90 Base) MCG/ACT inhaler Inhale 2 puffs into the lungs in the morning, at noon, and at bedtime. *May use every 4 hours as needed for wheezing  . allopurinol (ZYLOPRIM) 100 MG tablet Take 100 mg by mouth daily.  Marland Kitchen ALPRAZolam (XANAX) 0.25 MG tablet Take 0.25 mg by mouth 2 (two) times daily as needed.  Marland Kitchen apixaban (ELIQUIS) 5 MG TABS tablet Take 5 mg by mouth 2 (two) times daily.  . ARIPiprazole (ABILIFY) 2 MG tablet Take 2 mg by mouth in the morning.  Marland Kitchen aspirin EC 81 MG tablet Take 81 mg by mouth in the morning. Swallow whole.  . BD AUTOSHIELD DUO 30G X 5 MM MISC   . bisacodyl (DULCOLAX) 10 MG suppository Place 10 mg rectally daily as needed for moderate constipation.  . divalproex (DEPAKOTE) 125 MG DR tablet Take 125 mg by mouth at bedtime.  . DULoxetine (CYMBALTA) 30 MG capsule Take 30 mg by mouth daily at 12 noon.  . DULoxetine (CYMBALTA)  60 MG capsule Take 60 mg by mouth daily.  . ergocalciferol (VITAMIN D2) 1.25 MG (50000 UT) capsule Take 50,000 Units by mouth once a week.  . furosemide (LASIX) 20 MG tablet Take 20 mg by mouth in the morning.  . gabapentin (NEURONTIN) 100 MG capsule Take 100 mg by mouth in the morning and at bedtime.  . insulin aspart (NOVOLOG FLEXPEN) 100 UNIT/ML FlexPen Inject 10-16 Units into the skin 3 (three) times daily before meals.  . insulin detemir (LEVEMIR FLEXTOUCH) 100 UNIT/ML FlexPen Inject 40 Units into the skin at bedtime.  . isosorbide mononitrate (IMDUR) 30 MG 24 hr tablet Take 30 mg by mouth in the morning.  Marland Kitchen ketoconazole (NIZORAL) 2 % shampoo Apply 1 application topically 2 (two) times a week.  . levETIRAcetam (KEPPRA) 500 MG tablet Take 500 mg by mouth 2 (two) times daily.  Marland Kitchen levothyroxine (SYNTHROID) 125 MCG tablet Take 125 mcg by mouth daily.  Marland Kitchen lisinopril (ZESTRIL) 10 MG tablet Take 10 mg by mouth daily.  . magnesium oxide (MAG-OX) 400 MG tablet Take 400 mg by mouth in the morning and at bedtime.  . mirabegron ER (MYRBETRIQ) 50 MG TB24 tablet Take 1 tablet (50 mg total) by mouth daily.  . nitroGLYCERIN (NITROSTAT) 0.4 MG SL tablet Place 0.4 mg under the tongue every 5 (five) minutes as needed for chest pain.  Marland Kitchen Olopatadine HCl (PATADAY) 0.2 % SOLN Place 2 drops into both eyes daily.  Letta Pate ULTRA test strip 3 (three) times daily.  . polyethylene glycol (MIRALAX / GLYCOLAX) 17 g packet Take 17 g by mouth daily. *Mixed in 4-8 ounces of water-drink  . rosuvastatin (CRESTOR) 10 MG tablet Take 10 mg by mouth in the morning.  . senna-docusate (SENEXON-S) 8.6-50 MG tablet Take 2 tablets by mouth in the morning.  . simethicone (MYLICON) 80 MG chewable tablet Chew 80 mg by mouth in the morning.  . tamsulosin (FLOMAX) 0.4 MG CAPS capsule Take 0.4 mg by mouth in the morning. *Give after the same meal   No facility-administered encounter medications on file as of 08/01/2020.     ALLERGIES: Allergies  Allergen Reactions  . Celery Oil     Unknown-listed on [facility]  provided by facility  . Morphine And Related     Unknown-listed on MAR provided by facility  . Sulfa Antibiotics     VACCINATION STATUS:  There is no immunization history on file for this patient.  Diabetes He presents for his initial diabetic visit. He has type 2 diabetes mellitus. Onset time: He was diagnosed at approximate age of 44 years. His disease course has been worsening. There are no hypoglycemic associated symptoms. Pertinent negatives for hypoglycemia include no confusion, headaches, pallor or seizures. Associated symptoms include blurred vision, foot paresthesias, polydipsia and polyuria. Pertinent negatives for diabetes include no chest pain, no fatigue, no polyphagia and no weakness. There are no hypoglycemic complications. Symptoms are worsening. Risk factors for coronary artery disease include male sex, obesity, sedentary lifestyle, tobacco exposure, family history, diabetes mellitus and hypertension. Current diabetic treatments: He is currently on Levemir 52 units twice daily, NovoLog 21 units 1 time in the morning. His weight is fluctuating minimally. He is following a generally unhealthy diet. When asked about meal planning, he reported none. He has not had a previous visit with a dietitian. He never participates in exercise. His breakfast blood glucose range is generally 180-200 mg/dl. His overall blood glucose range is 180-200 mg/dl. (His recent logs are consistent with above target glycemic profile averaging between 180-200 mg..  His point-of-care A1c today is 8.8%.) An ACE inhibitor/angiotensin II receptor blocker is being taken. Eye exam is current.  Thyroid Problem Presents for initial visit. Onset time: Patient does not recall the circumstance of his diagnosis with hypothyroidism. Patient reports no constipation, diarrhea, fatigue or palpitations. Past treatments include levothyroxine  (Is currently on levothyroxine 125 mg p.o. daily.). The following procedures have not been performed: radioiodine uptake scan and thyroidectomy. His past medical history is significant for diabetes and obesity.  Hypertension This is a chronic problem. The current episode started more than 1 year ago. The problem is uncontrolled. Associated symptoms include blurred vision. Pertinent negatives include no chest pain, headaches, neck pain, palpitations or shortness of breath. Risk factors for coronary artery disease include diabetes mellitus, male gender, obesity, sedentary lifestyle, smoking/tobacco exposure and family history. Past treatments include ACE inhibitors. Identifiable causes of hypertension include a thyroid problem.     Review of Systems  Constitutional: Negative for chills, fatigue, fever and unexpected weight change.  HENT: Negative for dental problem, mouth sores and trouble swallowing.   Eyes: Positive for blurred vision. Negative for visual disturbance.  Respiratory: Negative for cough, choking, chest tightness, shortness of breath and wheezing.   Cardiovascular: Negative for chest pain, palpitations and leg swelling.  Gastrointestinal: Negative for abdominal distention, abdominal pain, constipation, diarrhea, nausea and vomiting.  Endocrine: Positive for polydipsia and polyuria. Negative for polyphagia.  Genitourinary: Negative for dysuria, flank pain, hematuria and urgency.  Musculoskeletal: Positive for gait problem. Negative for back pain, myalgias and neck pain.       He is wheelchair-bound due to previous CVA, deconditioning, disequilibrium.  Skin: Negative for pallor, rash and wound.  Neurological: Negative for seizures, syncope, weakness, numbness and headaches.  Psychiatric/Behavioral: Negative for confusion and dysphoric mood.    Objective:    Vitals with BMI 08/01/2020 06/28/2020 04/19/2020  Height   -  Weight 247 lbs 13 oz 235 lbs -  BMI 36.58 34.69 -   Systolic 142 144 829  Diastolic 62 75 69  Pulse 60 65 54    BP (!) 142/62   Pulse 60   Ht  (1.753 m)  Wt 247 lb 12.8 oz (112.4 kg)   BMI 36.59 kg/m   Wt Readings from Last 3 Encounters:  08/01/20 247 lb 12.8 oz (112.4 kg)  06/28/20 235 lb (106.6 kg)  04/19/20 235 lb (106.6 kg)     Physical Exam Constitutional:      General: He is not in acute distress.    Appearance: He is well-developed.  HENT:     Head: Normocephalic and atraumatic.  Neck:     Thyroid: No thyromegaly.     Trachea: No tracheal deviation.  Cardiovascular:     Rate and Rhythm: Normal rate.     Pulses:          Dorsalis pedis pulses are 1+ on the right side and 1+ on the left side.       Posterior tibial pulses are 1+ on the right side and 1+ on the left side.     Heart sounds: Normal heart sounds, S1 normal and S2 normal. No murmur heard.  No gallop.   Pulmonary:     Effort: Pulmonary effort is normal. No respiratory distress.     Breath sounds: No wheezing.  Abdominal:     General: Abdomen is flat. There is no distension.     Palpations: Abdomen is soft.     Tenderness: There is no abdominal tenderness. There is no guarding.  Musculoskeletal:     Right shoulder: No swelling or deformity.     Cervical back: Normal range of motion and neck supple.     Comments: Large edematous lower extremities.  Skin:    General: Skin is warm and dry.     Findings: No rash.     Nails: There is no clubbing.  Neurological:     Mental Status: He is alert and oriented to person, place, and time.     Cranial Nerves: No cranial nerve deficit.     Sensory: No sensory deficit.     Gait: Gait normal.     Deep Tendon Reflexes: Reflexes are normal and symmetric.  Psychiatric:        Speech: Speech normal.        Behavior: Behavior normal. Behavior is cooperative.        Thought Content: Thought content normal.        Judgment: Judgment normal.       CMP ( most recent) CMP     Component Value Date/Time    NA 138 04/19/2020 1746   K 3.8 04/19/2020 1746   CL 98 04/19/2020 1746   CO2 30 04/19/2020 1746   GLUCOSE 165 (H) 04/19/2020 1746   BUN 13 06/01/2020 0000   CREATININE 1.0 06/01/2020 0000   CREATININE 0.82 04/19/2020 1746   CALCIUM 9.1 04/19/2020 1746   GFRNONAA 80 06/01/2020 0000   GFRAA 97 06/01/2020 0000     Diabetic Labs (most recent): Lab Results  Component Value Date   HGBA1C 8.8 (A) 08/01/2020   HGBA1C 8.3 03/20/2020     Lab Results  Component Value Date   TSH 1.70 03/20/2020      Assessment & Plan:   1. Type 2 diabetes mellitus without complication, with long-term current use   - John Kirk has currently uncontrolled symptomatic type 2 DM since  79 years of age. Recent labs reviewed.  His recent logs are consistent with above target glycemic profile averaging between 180-200 mg..  His point-of-care A1c today is 8.8%.  - I had a long discussion with him about the progressive nature  of diabetes and the pathology behind its complications. -his diabetes is complicated by CVA, obesity/sedentary life and he remains at a high risk for more acute and chronic complications which include CAD, CVA, CKD, retinopathy, and neuropathy. These are all discussed in detail with him.  - I have counseled him on diet  and weight management  by adopting a carbohydrate restricted/protein rich diet. Patient is encouraged to switch to  unprocessed or minimally processed     complex starch and increased protein intake (animal or plant source), fruits, and vegetables. -  he is advised to stick to a routine mealtimes to eat 3 meals  a day and avoid unnecessary snacks ( to snack only to correct hypoglycemia).   - he admits that there is a room for improvement in his food and drink choices. - Suggestion is made for him to avoid simple carbohydrates  from his diet including Cakes, Sweet Desserts, Ice Cream, Soda (diet and regular), Sweet Tea, Candies, Chips, Cookies, Store Bought Juices,  Alcohol in Excess of  1-2 drinks a day, Artificial Sweeteners,  Coffee Creamer, and "Sugar-free" Products. This will help patient to have more stable blood glucose profile and potentially avoid unintended weight gain.  - he will be scheduled with John Kirk, RDN, CDE for diabetes education.  - I have approached him with the following individualized plan to manage  his diabetes and patient agrees:   -In light of his chronic glycemic burden, he will need intensive treatment with basal/bolus insulin in order for him to achieve and maintain control of diabetes to target. -Accordingly, I gave instruction to his aide who is accompanying him to the clinic to lower Levemir to 40 units nightly, increase NovoLog to 10  units 3 times a day with meals  for pre-meal BG readings of 90-150mg /dl, plus patient specific correction dose for unexpected hyperglycemia above 150mg /dl, associated with strict monitoring of glucose 4 times a day-before meals and at bedtime. - he is warned not to take insulin without proper monitoring per orders. - Adjustment parameters are given to him for hypo and hyperglycemia in writing. - he is encouraged to call clinic for blood glucose levels less than 70 or above 300 mg /dl.  - he will be considered for low-dose Metformin or incretin therapy as appropriate next visit.  - Specific targets for  A1c;  LDL, HDL,  and Triglycerides were discussed with the patient.  2) Blood Pressure /Hypertension:  his blood pressure is not controlled to target.   he is advised to continue his current medications including lisinopril 10 mg p.o. daily with breakfast . 3) Lipids/Hyperlipidemia: No recent lipid panel to review.  Patient's medication list does not include statins.  He will be considered for fasting lipid panel and statin intervention after his next visit.   4)  Weight/Diet:  Body mass index is 36.59 kg/m.  -   clearly complicating his diabetes care.   he is  a candidate for weight loss.  I discussed with him the fact that loss of 5 - 10% of his  current body weight will have the most impact on his diabetes management.  Exercise, and detailed carbohydrates information provided  -  detailed on discharge instructions.   5) hypothyroidism -Patient denies any prior history of thyroid surgery nor thyroid ablation.  He is currently on levothyroxine 125 mcg p.o. daily.  - We discussed about the correct intake of his thyroid hormone, on empty stomach at fasting, with water, separated by at least 30  minutes from breakfast and other medications,  and separated by more than 4 hours from calcium, iron, multivitamins, acid reflux medications (PPIs). -Patient is made aware of the fact that thyroid hormone replacement is needed for life, dose to be adjusted by periodic monitoring of thyroid function tests.   6) Chronic Care/Health Maintenance:  -he  is on ACEI medications and  is encouraged to initiate and continue to follow up with Ophthalmology, Dentist,  Podiatrist at least yearly or according to recommendations, and advised to  stay away from smoking. I have recommended yearly flu vaccine and pneumonia vaccine at least every 5 years; moderate intensity exercise for up to 150 minutes weekly; and  sleep for at least 7 hours a day.  - he is  advised to maintain close follow up with Dettinger, Elige RadonJoshua A, MD for primary care needs, as well as his other providers for optimal and coordinated care.   - Time spent in this patient care: 60 min, of which > 50% was spent in  counseling  him about his chronically uncontrolled, complicated type 2 diabetes; hypothyroidism; hypertension and the rest reviewing his blood glucose logs , discussing his hypoglycemia and hyperglycemia episodes, reviewing his current and  previous labs / studies  ( including abstraction from other facilities) and medications  doses and developing a  long term treatment plan based on the latest standards of care/ guidelines; and  documenting his care.    Please refer to Patient Instructions for Blood Glucose Monitoring and Insulin/Medications Dosing Guide"  in media tab for additional information. Please  also refer to " Patient Self Inventory" in the Media  tab for reviewed elements of pertinent patient history.  Hinton DyerGeorge M Kirk participated in the discussions, expressed understanding, and voiced agreement with the above plans.  All questions were answered to his satisfaction. he is encouraged to contact clinic should he have any questions or concerns prior to his return visit.   Follow up plan: - Return in about 2 weeks (around 08/15/2020) for F/U with Meter and Logs Only - no Labs.  Marquis LunchGebre Christell Steinmiller, MD [facility] [address] [place], [place] [address] Phone: [phone]  Fax: [phone]    08/01/2020, 3:27 PM  This note was partially dictated with voice recognition software. Similar sounding words can be transcribed inadequately or may not  be corrected upon review.

## 2020-08-01 NOTE — Patient Instructions (Signed)

## 2020-08-08 ENCOUNTER — Other Ambulatory Visit (HOSPITAL_COMMUNITY): Payer: Self-pay | Admitting: Specialist

## 2020-08-08 DIAGNOSIS — I69391 Dysphagia following cerebral infarction: Secondary | ICD-10-CM

## 2020-08-08 DIAGNOSIS — T17908S Unspecified foreign body in respiratory tract, part unspecified causing other injury, sequela: Secondary | ICD-10-CM

## 2020-08-08 DIAGNOSIS — R1312 Dysphagia, oropharyngeal phase: Secondary | ICD-10-CM

## 2020-08-09 ENCOUNTER — Ambulatory Visit: Payer: Medicare Other | Admitting: Urology

## 2020-08-15 ENCOUNTER — Other Ambulatory Visit: Payer: Self-pay

## 2020-08-15 ENCOUNTER — Ambulatory Visit (INDEPENDENT_AMBULATORY_CARE_PROVIDER_SITE_OTHER): Payer: Medicare Other | Admitting: Nurse Practitioner

## 2020-08-15 ENCOUNTER — Encounter: Payer: Self-pay | Admitting: Nurse Practitioner

## 2020-08-15 VITALS — BP 149/80 | HR 54 | Wt 247.0 lb

## 2020-08-15 DIAGNOSIS — Z794 Long term (current) use of insulin: Secondary | ICD-10-CM

## 2020-08-15 DIAGNOSIS — E039 Hypothyroidism, unspecified: Secondary | ICD-10-CM | POA: Diagnosis not present

## 2020-08-15 DIAGNOSIS — E119 Type 2 diabetes mellitus without complications: Secondary | ICD-10-CM | POA: Diagnosis not present

## 2020-08-15 NOTE — Patient Instructions (Signed)

## 2020-08-15 NOTE — Progress Notes (Signed)
Endocrinology Consult Note       08/15/2020, 11:13 AM   Subjective:    Patient ID: John Kirk, male    DOB: 06/22/1941.  John Kirk is being seen in consultation for management of currently uncontrolled symptomatic diabetes requested by  Dettinger, Elige RadonJoshua A, MD.   Past Medical History:  Diagnosis Date  . 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 Atrial fibrillation (HCC)   . CHF (congestive heart failure) (HCC)   . COPD (chronic obstructive pulmonary disease) (HCC)   . CVA (cerebral vascular accident) (HCC)   . DM2 (diabetes mellitus, type 2) (HCC)   . Hypertension   . Hypothyroidism   . Pneumonia     Past Surgical History:  Procedure Laterality Date  . CHOLECYSTECTOMY    . LUMBAR LAMINECTOMY     x 2  . NEPHROLITHOTOMY Right     Social History   Socioeconomic History  . Marital status: Widowed    Spouse name: Not on file  . Number of children: 1  . Years of education: Not on file  . Highest education level: Not on file  Occupational History  . Not on file  Tobacco Use  . Smoking status: Former Smoker    Years: 20.00  . Smokeless tobacco: Former NeurosurgeonUser    Types: Chew  . Tobacco comment: smoked from [age]yr to 26 yr  Vaping Use  . Vaping Use: Never used  Substance and Sexual Activity  . Alcohol use: Never  . Drug use: Never  . Sexual activity: Not on file  Other Topics Concern  . Not on file  Social History Narrative  . Not on file   Social Determinants of Health   Financial Resource Strain:   . Difficulty of Paying Living Expenses: Not on file  Food Insecurity:   . Worried About Programme researcher, broadcasting/film/videounning Out of Food in the Last Year: Not on file  . Ran Out of Food in the Last Year: Not on file  Transportation Needs:   . Lack of Transportation (Medical): Not on file  . Lack of Transportation (Non-Medical): Not on file  Physical Activity:   . Days of Exercise per  Week: Not on file  . Minutes of Exercise per Session: Not on file  Stress:   . Feeling of Stress : Not on file  Social Connections:   . Frequency of Communication with Friends and Family: Not on file  . Frequency of Social Gatherings with Friends and Family: Not on file  . Attends Religious Services: Not on file  . Active Member of Clubs or Organizations: Not on file  . Attends BankerClub or Organization Meetings: Not on file  . Marital Status: Not on file    Family History  Problem Relation Age of Onset  . Diabetes Mother   . Thyroid disease Mother   . Hypertension Mother   . Hyperlipidemia Mother   . Heart attack Mother   . Cancer Mother   . Hypertension Father   . Cancer Father     Outpatient Encounter Medications as of 08/15/2020  Medication Sig  . acetaminophen (TYLENOL) 500 MG tablet  Take 1,000 mg by mouth in the morning.  Marland Kitchen acetaminophen (TYLENOL) 500 MG tablet Take 1,000 mg by mouth every 6 (six) hours as needed for mild pain or moderate pain.  Marland Kitchen albuterol (VENTOLIN HFA) 108 (90 Base) MCG/ACT inhaler Inhale 2 puffs into the lungs in the morning, at noon, and at bedtime. *May use every 4 hours as needed for wheezing  . allopurinol (ZYLOPRIM) 100 MG tablet Take 100 mg by mouth daily.  Marland Kitchen ALPRAZolam (XANAX) 0.25 MG tablet Take 0.25 mg by mouth 2 (two) times daily as needed.  Marland Kitchen apixaban (ELIQUIS) 5 MG TABS tablet Take 5 mg by mouth 2 (two) times daily.  . ARIPiprazole (ABILIFY) 2 MG tablet Take 2 mg by mouth in the morning.  . BD AUTOSHIELD DUO 30G X 5 MM MISC   . bisacodyl (DULCOLAX) 10 MG suppository Place 10 mg rectally daily as needed for moderate constipation.  . divalproex (DEPAKOTE) 125 MG DR tablet Take 125 mg by mouth 2 (two) times daily.   Marland Kitchen doxycycline (VIBRA-TABS) 100 MG tablet Take 100 mg by mouth 2 (two) times daily.  . DULoxetine (CYMBALTA) 60 MG capsule Take 60 mg by mouth daily.  . ergocalciferol (VITAMIN D2) 1.25 MG (50000 UT) capsule Take 50,000 Units by mouth  once a week.  . furosemide (LASIX) 20 MG tablet Take 40 mg by mouth in the morning.   . gabapentin (NEURONTIN) 100 MG capsule Take 100 mg by mouth in the morning and at bedtime.  . insulin aspart (NOVOLOG FLEXPEN) 100 UNIT/ML FlexPen Inject 10-16 Units into the skin 3 (three) times daily before meals.  . insulin detemir (LEVEMIR FLEXTOUCH) 100 UNIT/ML FlexPen Inject 40 Units into the skin at bedtime.  . isosorbide mononitrate (IMDUR) 30 MG 24 hr tablet Take 30 mg by mouth in the morning.  Marland Kitchen ketoconazole (NIZORAL) 2 % shampoo Apply 1 application topically 2 (two) times a week.  . levETIRAcetam (KEPPRA) 500 MG tablet Take 500 mg by mouth 2 (two) times daily.  Marland Kitchen levothyroxine (SYNTHROID) 125 MCG tablet Take 125 mcg by mouth daily.  Marland Kitchen lisinopril (ZESTRIL) 10 MG tablet Take 10 mg by mouth daily.  Marland Kitchen MAGNESIUM OXIDE PO Take 500 mg by mouth 2 (two) times daily.   . mirabegron ER (MYRBETRIQ) 50 MG TB24 tablet Take 1 tablet (50 mg total) by mouth daily.  . mupirocin ointment (BACTROBAN) 2 % SMARTSIG:1 Application Topical 2-3 Times Daily  . nitroGLYCERIN (NITROSTAT) 0.4 MG SL tablet Place 0.4 mg under the tongue every 5 (five) minutes as needed for chest pain.  Marland Kitchen Olopatadine HCl (PATADAY) 0.2 % SOLN Place 2 drops into both eyes daily.  Letta Pate ULTRA test strip 3 (three) times daily.  . polyethylene glycol (MIRALAX / GLYCOLAX) 17 g packet Take 17 g by mouth daily. *Mixed in 4-8 ounces of water-drink  . Propylene Glycol (SYSTANE COMPLETE) 0.6 % SOLN Apply to eye.  . rosuvastatin (CRESTOR) 10 MG tablet Take 10 mg by mouth in the morning.  . senna-docusate (SENEXON-S) 8.6-50 MG tablet Take 2 tablets by mouth in the morning.  . simethicone (MYLICON) 80 MG chewable tablet Chew 80 mg by mouth in the morning.  . tamsulosin (FLOMAX) 0.4 MG CAPS capsule Take 0.4 mg by mouth in the morning. *Give after the same meal  . aspirin EC 81 MG tablet Take 81 mg by mouth in the morning. Swallow whole. (Patient not  taking: Reported on 08/15/2020)  . DULoxetine (CYMBALTA) 30 MG capsule Take 30 mg  by mouth daily at 12 noon. (Patient not taking: Reported on 08/15/2020)   No facility-administered encounter medications on file as of 08/15/2020.    ALLERGIES: Allergies  Allergen Reactions  . Celery Oil     Unknown-listed on MAR provided by facility  . Morphine And Related     Unknown-listed on MAR provided by facility  . Sulfa Antibiotics     VACCINATION STATUS:  There is no immunization history on file for this patient.  Diabetes He presents for his initial diabetic visit. He has type 2 diabetes mellitus. Onset time: He was diagnosed at approximate age of 44 years. His disease course has been stable. There are no hypoglycemic associated symptoms. Pertinent negatives for hypoglycemia include no confusion, headaches, pallor or seizures. Associated symptoms include blurred vision, foot paresthesias, polydipsia and polyuria. Pertinent negatives for diabetes include no chest pain, no fatigue, no polyphagia and no weakness. There are no hypoglycemic complications. Symptoms are stable. Diabetic complications include a CVA. Risk factors for coronary artery disease include male sex, obesity, sedentary lifestyle, tobacco exposure, family history, diabetes mellitus, hypertension and dyslipidemia. Current diabetic treatment includes intensive insulin program. He is compliant with treatment all of the time (with assistance from assisted living facility staff). His weight is fluctuating minimally. He is following a generally unhealthy diet. When asked about meal planning, he reported none. He has not had a previous visit with a dietitian. He never participates in exercise. His breakfast blood glucose range is generally 140-180 mg/dl. His lunch blood glucose range is generally >200 mg/dl. His dinner blood glucose range is generally >200 mg/dl. His bedtime blood glucose range is generally >200 mg/dl. (He presents today,  accompanied by staff from ALF, with logs showing improved fasting glycemic profile and worsening postprandial hyperglycemia.  Staff member tells me he "eats what he wants" and frequently visiting the snack machine- he says "I don't have but a few months left to live, I am going to enjoy the time I have left."  There is no documented or reported episodes of hypoglycemia.) An ACE inhibitor/angiotensin II receptor blocker is being taken. He does not see a podiatrist.Eye exam is current.  Thyroid Problem Presents for follow-up visit. Onset time: Patient does not recall the circumstance of his diagnosis with hypothyroidism. Patient reports no constipation, diarrhea, fatigue or palpitations. The symptoms have been stable. Past treatments include levothyroxine (Is currently on levothyroxine 125 mg p.o. daily.). The following procedures have not been performed: radioiodine uptake scan and thyroidectomy. His past medical history is significant for diabetes and obesity.  Hypertension This is a chronic problem. The current episode started more than 1 year ago. The problem is unchanged. The problem is controlled. Associated symptoms include blurred vision. Pertinent negatives include no chest pain, headaches, neck pain, palpitations or shortness of breath. Agents associated with hypertension include thyroid hormones. Risk factors for coronary artery disease include diabetes mellitus, male gender, obesity, sedentary lifestyle, smoking/tobacco exposure, family history and dyslipidemia. Past treatments include ACE inhibitors and diuretics. The current treatment provides mild improvement. There are no compliance problems.  Hypertensive end-organ damage includes CVA. Identifiable causes of hypertension include a thyroid problem.    Review of systems  Constitutional: + Minimally fluctuating body weight,  current Body mass index is 36.48 kg/m. , + fatigue, no subjective hyperthermia, no subjective hypothermia Eyes: + blurry  vision- improving, no xerophthalmia ENT: no sore throat, no nodules palpated in throat, no dysphagia/odynophagia, no hoarseness Cardiovascular: no chest pain, no shortness of breath, no palpitations, +  leg swelling Respiratory: no cough, no shortness of breath Gastrointestinal: no nausea/vomiting/diarrhea Musculoskeletal: no muscle/joint aches, wheelchair bound due to previous CVA, disequilibrium, deconditioning Skin: no rashes, no hyperemia Neurological: no tremors, no numbness, no tingling, no dizziness Psychiatric: no depression, no anxiety    Objective:    BP (!) 149/80 (BP Location: Left Arm, Patient Position: Sitting)   Pulse (!) 54   Wt 247 lb (112 kg)   BMI 36.48 kg/m   Wt Readings from Last 3 Encounters:  08/15/20 247 lb (112 kg)  08/01/20 247 lb 12.8 oz (112.4 kg)  06/28/20 235 lb (106.6 kg)    BP Readings from Last 3 Encounters:  08/15/20 (!) 149/80  08/01/20 (!) 142/62  06/28/20 (!) 144/75     Physical Exam- Limited  Constitutional:  Body mass index is 36.48 kg/m. , not in acute distress, flat affect Eyes:  EOMI, no exophthalmos Neck: Supple Thyroid: No gross goiter Cardiovascular: bradycardic, no murmers, rubs, or gallops, + BLE pitting edema  Respiratory: Adequate breathing efforts, no crackles, rales, rhonchi, or wheezing Musculoskeletal: no gross deformities, wheelchair bound due to previous CVA Skin:  no rashes, no hyperemia Neurological: no tremor with outstretched hands    CMP ( most recent) CMP     Component Value Date/Time   NA 138 04/19/2020 1746   K 3.8 04/19/2020 1746   CL 98 04/19/2020 1746   CO2 30 04/19/2020 1746   GLUCOSE 165 (H) 04/19/2020 1746   BUN 13 06/01/2020 0000   CREATININE 1.0 06/01/2020 0000   CREATININE 0.82 04/19/2020 1746   CALCIUM 9.1 04/19/2020 1746   GFRNONAA 80 06/01/2020 0000   GFRAA 97 06/01/2020 0000     Diabetic Labs (most recent): Lab Results  Component Value Date   HGBA1C 8.8 (A) 08/01/2020    HGBA1C 8.3 03/20/2020     Lab Results  Component Value Date   TSH 1.70 03/20/2020      Assessment & Plan:   1. Type 2 diabetes mellitus without complication, with long-term current use   - John Kirk has currently uncontrolled symptomatic type 2 DM since  79 years of age. Recent labs reviewed.  He presents today, accompanied by staff from ALF, with logs showing improved fasting glycemic profile and worsening postprandial hyperglycemia.  Staff member tells me he "eats what he wants" and frequently visiting the snack machine- he says "I don't have but a few months left to live, I am going to enjoy the time I have left."  There is no documented or reported episodes of hypoglycemia.  - I had a long discussion with him about the progressive nature of diabetes and the pathology behind its complications. -his diabetes is complicated by CVA, obesity/sedentary life and he remains at a high risk for more acute and chronic complications which include CAD, CVA, CKD, retinopathy, and neuropathy. These are all discussed in detail with him.  - Nutritional counseling repeated at each appointment due to patients tendency to fall back in to old habits.  - The patient admits there is a room for improvement in their diet and drink choices. -  Suggestion is made for the patient to avoid simple carbohydrates from their diet including Cakes, Sweet Desserts / Pastries, Ice Cream, Soda (diet and regular), Sweet Tea, Candies, Chips, Cookies, Sweet Pastries,  Store Bought Juices, Alcohol in Excess of  1-2 drinks a day, Artificial Sweeteners, Coffee Creamer, and "Sugar-free" Products. This will help patient to have stable blood glucose profile and potentially avoid unintended  weight gain.   - I encouraged the patient to switch to  unprocessed or minimally processed complex starch and increased protein intake (animal or plant source), fruits, and vegetables.   - Patient is advised to stick to a routine mealtimes  to eat 3 meals  a day and avoid unnecessary snacks ( to snack only to correct hypoglycemia).  - I have approached him with the following individualized plan to manage  his diabetes and patient agrees:   -In light of his chronic glycemic burden, he will need intensive treatment with basal/bolus insulin in order for him to achieve and maintain control of diabetes to target.  -He will tolerate slight increase in his Levemir to 50 units SQ daily at bedtime, continue Novolog 10-16 units TID with meals if glucose is above 90 and he is eating.  Specific instructions on how to titrate insulin dose based on glucose readings given to patient/aide in writing.  Recent kidney function is stable, he will tolerate addition of low dose Metformin.  I discussed and initiated Metformin 500 mg ER daily with breakfast.  -He is encouraged to continue monitoring glucose 4 times daily, before meals and before bed, and call the clinic if he has readings less than 70 or greater than 300 for 3 tests in a row.  - Specific targets for  A1c;  LDL, HDL,  and Triglycerides were discussed with the patient.  2) Blood Pressure /Hypertension:   his blood pressure is controlled to target for his age.   he is advised to continue his current medications including Lisinopril 10 mg p.o. daily with breakfast and Lasix 20 mg po daily.  3) Lipids/Hyperlipidemia:  No recent lipid panel to review.  Patient's medication list does not include statins.  He will be considered for fasting lipid panel and statin intervention after his next visit.   4)  Weight/Diet:  His Body mass index is 36.48 kg/m.  -   clearly complicating his diabetes care.   he is  a candidate for weight loss. I discussed with him the fact that loss of 5 - 10% of his  current body weight will have the most impact on his diabetes management.  Exercise, and detailed carbohydrates information provided  -  detailed on discharge instructions.  5) Hypothyroidism -Patient denies  any prior history of thyroid surgery nor thyroid ablation.  He is currently on levothyroxine 125 mcg p.o. daily.  - We discussed about the correct intake of his thyroid hormone, on empty stomach at fasting, with water, separated by at least 30 minutes from breakfast and other medications,  and separated by more than 4 hours from calcium, iron, multivitamins, acid reflux medications (PPIs). -Patient is made aware of the fact that thyroid hormone replacement is needed for life, dose to be adjusted by periodic monitoring of thyroid function tests.  6) Chronic Care/Health Maintenance: -he  is on ACEI medications and  is encouraged to initiate and continue to follow up with Ophthalmology, Dentist,  Podiatrist at least yearly or according to recommendations, and advised to  stay away from smoking. I have recommended yearly flu vaccine and pneumonia vaccine at least every 5 years; moderate intensity exercise for up to 150 minutes weekly; and  sleep for at least 7 hours a day.  - he is  advised to maintain close follow up with Dettinger, Elige Radon, MD for primary care needs, as well as his other providers for optimal and coordinated care.   - Time spent on this patient  care encounter:  35 min, of which > 50% was spent in  counseling and the rest reviewing his blood glucose logs , discussing his hypoglycemia and hyperglycemia episodes, reviewing his current and  previous labs / studies  ( including abstraction from other facilities) and medications  doses and developing a  long term treatment plan and documenting his care.   Please refer to Patient Instructions for Blood Glucose Monitoring and Insulin/Medications Dosing Guide"  in media tab for additional information. Please  also refer to " Patient Self Inventory" in the Media  tab for reviewed elements of pertinent patient history.  John Dyer participated in the discussions, expressed understanding, and voiced agreement with the above plans.  All questions  were answered to his satisfaction. he is encouraged to contact clinic should he have any questions or concerns prior to his return visit.   Follow up plan: - Return in about 3 months (around 11/15/2020) for Diabetes follow up- A1c and urine micro in office, Previsit labs.  Ronny Bacon, [facility] [address] [place], [place] [address] Phone: [phone] Fax: [phone]  08/15/2020, 11:13 AM

## 2020-08-16 ENCOUNTER — Ambulatory Visit: Payer: Medicare Other | Admitting: Urology

## 2020-08-19 ENCOUNTER — Ambulatory Visit (HOSPITAL_COMMUNITY): Payer: Medicare Other | Attending: Nurse Practitioner | Admitting: Speech Pathology

## 2020-08-19 ENCOUNTER — Other Ambulatory Visit: Payer: Self-pay

## 2020-08-19 ENCOUNTER — Ambulatory Visit (HOSPITAL_COMMUNITY)
Admission: RE | Admit: 2020-08-19 | Discharge: 2020-08-19 | Disposition: A | Discharge status: Home / Self Care | Payer: Medicare Other | Source: Ambulatory Visit | Attending: Nurse Practitioner | Admitting: Nurse Practitioner

## 2020-08-19 ENCOUNTER — Encounter (HOSPITAL_COMMUNITY): Payer: Self-pay | Admitting: Speech Pathology

## 2020-08-19 DIAGNOSIS — R1312 Dysphagia, oropharyngeal phase: Secondary | ICD-10-CM | POA: Insufficient documentation

## 2020-08-19 DIAGNOSIS — I69391 Dysphagia following cerebral infarction: Secondary | ICD-10-CM | POA: Diagnosis present

## 2020-08-19 DIAGNOSIS — T17908S Unspecified foreign body in respiratory tract, part unspecified causing other injury, sequela: Secondary | ICD-10-CM | POA: Diagnosis present

## 2020-08-19 NOTE — Therapy (Signed)
[facility] [address] [place], [place], [address] Phone: [phone]   Fax:  [phone]  Modified Barium Swallow  Patient Details  Name: John Kirk MRN: [id number] Date of Birth: June 09, 1941 No data recorded  Encounter Date: 08/19/2020   End of Session - 08/19/20 1404    Visit Number 1    Number of Visits 1    Authorization Type Medicare    SLP Start Time 1133    SLP Stop Time  1205    SLP Time Calculation (min) 32 min    Activity Tolerance Patient tolerated treatment well           Past Medical History:  Diagnosis Date  . 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 Atrial fibrillation (HCC)   . CHF (congestive heart failure) (HCC)   . COPD (chronic obstructive pulmonary disease) (HCC)   . CVA (cerebral vascular accident) (HCC)   . DM2 (diabetes mellitus, type 2) (HCC)   . Hypertension   . Hypothyroidism   . Pneumonia     Past Surgical History:  Procedure Laterality Date  . CHOLECYSTECTOMY    . LUMBAR LAMINECTOMY     x 2  . NEPHROLITHOTOMY Right     There were no vitals filed for this visit.   Subjective Assessment - 08/19/20 1339    Subjective "I get strangled on lots of things."    Special Tests MBSS    Currently in Pain? No/denies               General - 08/19/20 1342      General Information   Date of Onset 08/07/20    HPI John Kirk is an 79 yo male resident of [facility], who was referred for MBSS due to reports of Pt coughing when eating and drinking.    Type of Study MBS-Modified Barium Swallow Study    Previous Swallow Assessment None on record    Diet Prior to this Study Regular;Thin liquids    Temperature Spikes Noted No    Respiratory Status Room air    History of Recent Intubation No    Behavior/Cognition Alert;Cooperative;Pleasant mood    Oral Cavity Assessment Within Functional Limits    Oral Care Completed by SLP No    Oral Cavity - Dentition  Dentures, top;Dentures, bottom    Vision Functional for self feeding    Self-Feeding Abilities Able to feed self;Needs assist    Patient Positioning Upright in chair    Baseline Vocal Quality Normal;Wet    Volitional Cough Strong    Volitional Swallow Able to elicit    Anatomy Within functional limits    Pharyngeal Secretions Not observed secondary MBS              Oral Preparation/Oral Phase - 08/19/20 1344      Oral Preparation/Oral Phase   Oral Phase Impaired      Oral - Nectar   Oral - Nectar Cup Within functional limits    Oral - Nectar Straw Within functional limits      Oral - Thin   Oral - Thin Teaspoon Right anterior bolus loss    Oral - Thin Cup Right anterior bolus loss    Oral - Thin Straw Within functional limits      Oral - Solids   Oral - Puree Weak ligual manipulation    Oral - Regular Oral residue;Decreased bolus cohesion;Weak ligual manipulation    Oral - Pill Within  functional limits      Electrical stimulation - Oral Phase   Was Electrical Stimulation Used No            Pharyngeal Phase - 08/19/20 1349      Pharyngeal Phase   Pharyngeal Phase Impaired      Pharyngeal - Nectar   Pharyngeal- Nectar Cup Swallow initiation at vallecula;Penetration/Aspiration during swallow    Pharyngeal Material enters airway, remains ABOVE vocal cords and not ejected out;Material does not enter airway    Pharyngeal- Nectar Straw Swallow initiation at vallecula;Penetration/Aspiration during swallow    Pharyngeal Material does not enter airway;Material enters airway, remains ABOVE vocal cords and not ejected out      Pharyngeal - Thin   Pharyngeal- Thin Teaspoon Swallow initiation at vallecula;Reduced airway/laryngeal closure;Penetration/Aspiration during swallow;Trace aspiration    Pharyngeal Material does not enter airway;Material enters airway, passes BELOW cords without attempt by patient to eject out (silent aspiration)    Pharyngeal- Thin Cup  Penetration/Aspiration during swallow;Reduced airway/laryngeal closure;Trace aspiration    Pharyngeal Material enters airway, passes BELOW cords without attempt by patient to eject out (silent aspiration);Material does not enter airway    Pharyngeal- Thin Straw Penetration/Aspiration during swallow;Trace aspiration;Reduced airway/laryngeal closure    Pharyngeal Material enters airway, passes BELOW cords and not ejected out despite cough attempt by patient;Material enters airway, passes BELOW cords without attempt by patient to eject out (silent aspiration)      Pharyngeal - Solids   Pharyngeal- Puree Within functional limits;Swallow initiation at vallecula    Pharyngeal- Regular Delayed swallow initiation-vallecula;Within functional limits    Pharyngeal- Pill Within functional limits   in puree     Electrical Stimulation - Pharyngeal Phase   Was Electrical Stimulation Used No            Cricopharyngeal Phase - 08/19/20 1402      Cervical Esophageal Phase   Cervical Esophageal Phase Within functional limits              Plan - 08/19/20 1405    Clinical Impression Statement Pt presents with moderate oropharyngeal dysphagia characterized by reduced labial seal (right side), weak lingual manipulation, reduced bolus cohesion, premature spillage with thin liquids, and reduced laryngeal vestibule closure resulting in penetration/aspiration during the swallow in trace amounts with thin liquids without benefit of a cough, however vocal quality wet, and flash penetration of nectars; puree and solids WNL for pharyngeal phase. A modified supraglottic swallow was attempted with with thin liquids, however this resulted in increased residuals in the pharynx post swallow which were then aspirated. Nectar thick liquids appeared the safest, however Pt stated that he dislikes them and he prefers to stay on thin liquids. He was encouraged to discuss his thoughts/feelings with his care team. If Pt chooses to  stay on thin liquids, recommend implementing good oral care routine to decrease the bacteria load to minimize risks of developing aspiration pneumonia. Recommend regular textures with cut up meats if facility can accommodate or mechanical soft and either thin liquids via cup sip with periodic cough/throat clear and repeat swallow OR nectar-thick liquids.           Patient will benefit from skilled therapeutic intervention in order to improve the following deficits and impairments:   Dysphagia, oropharyngeal phase     Recommendations/Treatment - 08/19/20 1402      Swallow Evaluation Recommendations   SLP Diet Recommendations Thin;Nectar;Dysphagia 3 (mechanical soft)    Liquid Administration via Cup    Medication Administration Whole meds  with puree    Supervision Patient able to self feed;Full supervision/cueing for compensatory strategies    Compensations Slow rate;Small sips/bites;Clear throat intermittently    Postural Changes Seated upright at 90 degrees;Remain upright for at least 30 minutes after feeds/meals            Prognosis - 08/19/20 1404      Prognosis   Prognosis for Safe Diet Advancement Fair    Barriers to Reach Goals Severity of deficits      Individuals Consulted   Consulted and Agree with Results and Recommendations Patient    Report Sent to  Referring physician           Problem List Patient Active Problem List   Diagnosis Date Noted  . Type 2 diabetes mellitus without complication, with long-term current use of insulin (HCC) 08/01/2020  . Hypothyroidism 08/01/2020   Thank you,  Havery Moros, CCC-SLP [phone]  [facility] 08/19/2020, 2:34 PM  [facility] [address] [place], [place], [address] Phone: [phone]   Fax:  [phone]  Name: John Kirk MRN: [id number] Date of Birth: Aug 17, 1941

## 2020-09-13 ENCOUNTER — Ambulatory Visit: Payer: Medicare Other | Admitting: Urology

## 2020-11-04 LAB — LIPID PANEL
Cholesterol: 133 (ref 0–200)
HDL: 31 — AB (ref 35–70)
LDL Cholesterol: 75
Triglycerides: 156 (ref 40–160)

## 2020-11-13 DIAGNOSIS — Z79899 Other long term (current) drug therapy: Secondary | ICD-10-CM | POA: Diagnosis not present

## 2020-11-13 DIAGNOSIS — E119 Type 2 diabetes mellitus without complications: Secondary | ICD-10-CM | POA: Diagnosis not present

## 2020-11-13 DIAGNOSIS — E7849 Other hyperlipidemia: Secondary | ICD-10-CM | POA: Diagnosis not present

## 2020-11-13 DIAGNOSIS — D518 Other vitamin B12 deficiency anemias: Secondary | ICD-10-CM | POA: Diagnosis not present

## 2020-11-13 LAB — BASIC METABOLIC PANEL WITH GFR
BUN: 19 (ref 4–21)
CO2: 26 — AB (ref 13–22)
Chloride: 102 (ref 99–108)
Creatinine: 1.1 (ref 0.6–1.3)
Glucose: 233
Potassium: 4.3 (ref 3.4–5.3)
Sodium: 142 (ref 137–147)

## 2020-11-13 LAB — HEPATIC FUNCTION PANEL
ALT: 9 — AB (ref 10–40)
AST: 16 (ref 14–40)
Alkaline Phosphatase: 37 (ref 25–125)
Bilirubin, Total: 0.4

## 2020-11-13 LAB — COMPREHENSIVE METABOLIC PANEL WITH GFR
Albumin: 4 (ref 3.5–5.0)
Calcium: 9.2 (ref 8.7–10.7)
GFR calc Af Amer: 77
GFR calc non Af Amer: 67
Globulin: 2.4

## 2020-11-14 LAB — TSH: TSH: 2.74 (ref 0.41–5.90)

## 2020-11-18 ENCOUNTER — Ambulatory Visit: Payer: Medicare Other | Admitting: Nurse Practitioner

## 2020-11-19 DIAGNOSIS — H905 Unspecified sensorineural hearing loss: Secondary | ICD-10-CM | POA: Diagnosis not present

## 2020-11-20 DIAGNOSIS — U071 COVID-19: Secondary | ICD-10-CM | POA: Diagnosis not present

## 2020-11-21 ENCOUNTER — Ambulatory Visit: Payer: Medicare Other | Admitting: Family Medicine

## 2020-11-26 DIAGNOSIS — U071 COVID-19: Secondary | ICD-10-CM | POA: Diagnosis not present

## 2020-11-28 DIAGNOSIS — D518 Other vitamin B12 deficiency anemias: Secondary | ICD-10-CM | POA: Diagnosis not present

## 2020-11-28 DIAGNOSIS — E119 Type 2 diabetes mellitus without complications: Secondary | ICD-10-CM | POA: Diagnosis not present

## 2020-11-28 DIAGNOSIS — E038 Other specified hypothyroidism: Secondary | ICD-10-CM | POA: Diagnosis not present

## 2020-11-28 DIAGNOSIS — I10 Essential (primary) hypertension: Secondary | ICD-10-CM | POA: Diagnosis not present

## 2020-11-28 DIAGNOSIS — I2510 Atherosclerotic heart disease of native coronary artery without angina pectoris: Secondary | ICD-10-CM | POA: Diagnosis not present

## 2020-11-28 DIAGNOSIS — E1165 Type 2 diabetes mellitus with hyperglycemia: Secondary | ICD-10-CM | POA: Diagnosis not present

## 2020-11-28 DIAGNOSIS — J449 Chronic obstructive pulmonary disease, unspecified: Secondary | ICD-10-CM | POA: Diagnosis not present

## 2020-11-28 DIAGNOSIS — E559 Vitamin D deficiency, unspecified: Secondary | ICD-10-CM | POA: Diagnosis not present

## 2020-11-28 DIAGNOSIS — I4891 Unspecified atrial fibrillation: Secondary | ICD-10-CM | POA: Diagnosis not present

## 2020-11-28 DIAGNOSIS — I509 Heart failure, unspecified: Secondary | ICD-10-CM | POA: Diagnosis not present

## 2020-11-28 DIAGNOSIS — E1142 Type 2 diabetes mellitus with diabetic polyneuropathy: Secondary | ICD-10-CM | POA: Diagnosis not present

## 2020-12-04 ENCOUNTER — Ambulatory Visit (INDEPENDENT_AMBULATORY_CARE_PROVIDER_SITE_OTHER): Payer: Medicare Other | Admitting: Nurse Practitioner

## 2020-12-04 ENCOUNTER — Other Ambulatory Visit: Payer: Self-pay

## 2020-12-04 ENCOUNTER — Encounter: Payer: Self-pay | Admitting: Nurse Practitioner

## 2020-12-04 VITALS — BP 125/76 | HR 74 | Wt 234.0 lb

## 2020-12-04 DIAGNOSIS — Z794 Long term (current) use of insulin: Secondary | ICD-10-CM

## 2020-12-04 DIAGNOSIS — E119 Type 2 diabetes mellitus without complications: Secondary | ICD-10-CM

## 2020-12-04 DIAGNOSIS — E039 Hypothyroidism, unspecified: Secondary | ICD-10-CM | POA: Diagnosis not present

## 2020-12-04 LAB — POCT GLYCOSYLATED HEMOGLOBIN (HGB A1C): HbA1c, POC (controlled diabetic range): 9.5 % — AB (ref 0.0–7.0)

## 2020-12-04 NOTE — Patient Instructions (Signed)

## 2020-12-04 NOTE — Progress Notes (Signed)
Endocrinology Follow Up Note       12/04/2020, 11:08 AM   Subjective:    Patient ID: John Kirk, male    DOB: 1941-07-06.  John Kirk is being seen in follow up after being seen in consultation for management of currently uncontrolled symptomatic diabetes requested by  Dettinger, Elige Radon, MD.   Past Medical History:  Diagnosis Date  . 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 Atrial fibrillation (HCC)   . CHF (congestive heart failure) (HCC)   . COPD (chronic obstructive pulmonary disease) (HCC)   . CVA (cerebral vascular accident) (HCC)   . DM2 (diabetes mellitus, type 2) (HCC)   . Hypertension   . Hypothyroidism   . Pneumonia     Past Surgical History:  Procedure Laterality Date  . CHOLECYSTECTOMY    . LUMBAR LAMINECTOMY     x 2  . NEPHROLITHOTOMY Right     Social History   Socioeconomic History  . Marital status: Widowed    Spouse name: Not on file  . Number of children: 1  . Years of education: Not on file  . Highest education level: Not on file  Occupational History  . Not on file  Tobacco Use  . Smoking status: Former Smoker    Years: 20.00  . Smokeless tobacco: Former Neurosurgeon    Types: Chew  . Tobacco comment: smoked from 50yr to 26 yr  Vaping Use  . Vaping Use: Never used  Substance and Sexual Activity  . Alcohol use: Never  . Drug use: Never  . Sexual activity: Not on file  Other Topics Concern  . Not on file  Social History Narrative  . Not on file   Social Determinants of Health   Financial Resource Strain: Not on file  Food Insecurity: Not on file  Transportation Needs: Not on file  Physical Activity: Not on file  Stress: Not on file  Social Connections: Not on file    Family History  Problem Relation Age of Onset  . Diabetes Mother   . Thyroid disease Mother   . Hypertension Mother   . Hyperlipidemia Mother   . Heart  attack Mother   . Cancer Mother   . Hypertension Father   . Cancer Father     Outpatient Encounter Medications as of 12/04/2020  Medication Sig  . acetaminophen (TYLENOL) 500 MG tablet Take 1,000 mg by mouth in the morning.  Marland Kitchen acetaminophen (TYLENOL) 500 MG tablet Take 1,000 mg by mouth every 6 (six) hours as needed for mild pain or moderate pain.  Marland Kitchen albuterol (VENTOLIN HFA) 108 (90 Base) MCG/ACT inhaler Inhale 2 puffs into the lungs in the morning, at noon, and at bedtime. *May use every 4 hours as needed for wheezing  . allopurinol (ZYLOPRIM) 100 MG tablet Take 100 mg by mouth daily.  Marland Kitchen ALPRAZolam (XANAX) 0.25 MG tablet Take 0.25 mg by mouth 2 (two) times daily as needed.  Marland Kitchen apixaban (ELIQUIS) 5 MG TABS tablet Take 5 mg by mouth 2 (two) times daily.  . ARIPiprazole (ABILIFY) 2 MG tablet Take 2 mg by mouth in the  morning.  Marland Kitchen aspirin EC 81 MG tablet Take 81 mg by mouth in the morning. Swallow whole.  . BD AUTOSHIELD DUO 30G X 5 MM MISC   . bisacodyl (DULCOLAX) 10 MG suppository Place 10 mg rectally daily as needed for moderate constipation.  . divalproex (DEPAKOTE) 125 MG DR tablet Take 125 mg by mouth 2 (two) times daily.   Marland Kitchen doxycycline (VIBRA-TABS) 100 MG tablet Take 100 mg by mouth 2 (two) times daily.  . DULoxetine (CYMBALTA) 30 MG capsule Take 30 mg by mouth daily at 12 noon.  . DULoxetine (CYMBALTA) 60 MG capsule Take 60 mg by mouth daily.  . ergocalciferol (VITAMIN D2) 1.25 MG (50000 UT) capsule Take 50,000 Units by mouth once a week.  . furosemide (LASIX) 20 MG tablet Take 40 mg by mouth in the morning.   . gabapentin (NEURONTIN) 100 MG capsule Take 100 mg by mouth in the morning and at bedtime.  . insulin aspart (NOVOLOG FLEXPEN) 100 UNIT/ML FlexPen Inject 12-18 Units into the skin 3 (three) times daily before meals.  . insulin detemir (LEVEMIR FLEXTOUCH) 100 UNIT/ML FlexPen Inject 60 Units into the skin at bedtime.  . isosorbide mononitrate (IMDUR) 30 MG 24 hr tablet Take 30 mg  by mouth in the morning.  Marland Kitchen ketoconazole (NIZORAL) 2 % shampoo Apply 1 application topically 2 (two) times a week.  . levETIRAcetam (KEPPRA) 500 MG tablet Take 500 mg by mouth 2 (two) times daily.  Marland Kitchen levothyroxine (SYNTHROID) 125 MCG tablet Take 125 mcg by mouth daily.  Marland Kitchen lisinopril (ZESTRIL) 10 MG tablet Take 10 mg by mouth daily.  Marland Kitchen MAGNESIUM OXIDE PO Take 500 mg by mouth 2 (two) times daily.   . mirabegron ER (MYRBETRIQ) 50 MG TB24 tablet Take 1 tablet (50 mg total) by mouth daily.  . mupirocin ointment (BACTROBAN) 2 % SMARTSIG:1 Application Topical 2-3 Times Daily  . nitroGLYCERIN (NITROSTAT) 0.4 MG SL tablet Place 0.4 mg under the tongue every 5 (five) minutes as needed for chest pain.  Marland Kitchen Olopatadine HCl 0.2 % SOLN Place 2 drops into both eyes daily.  Letta Pate ULTRA test strip 3 (three) times daily.  . polyethylene glycol (MIRALAX / GLYCOLAX) 17 g packet Take 17 g by mouth daily. *Mixed in 4-8 ounces of water-drink  . Propylene Glycol (SYSTANE COMPLETE) 0.6 % SOLN Apply to eye.  . rosuvastatin (CRESTOR) 10 MG tablet Take 10 mg by mouth in the morning.  . senna-docusate (SENOKOT-S) 8.6-50 MG tablet Take 2 tablets by mouth in the morning.  . simethicone (MYLICON) 80 MG chewable tablet Chew 80 mg by mouth in the morning.  . tamsulosin (FLOMAX) 0.4 MG CAPS capsule Take 0.4 mg by mouth in the morning. *Give after the same meal   No facility-administered encounter medications on file as of 12/04/2020.    ALLERGIES: Allergies  Allergen Reactions  . Celery Oil     Unknown-listed on MAR provided by facility  . Morphine And Related     Unknown-listed on MAR provided by facility  . Sulfa Antibiotics     VACCINATION STATUS:  There is no immunization history on file for this patient.  Diabetes He presents for his follow-up diabetic visit. He has type 2 diabetes mellitus. Onset time: He was diagnosed at approximate age of 80 years. His disease course has been worsening. There are no  hypoglycemic associated symptoms. Pertinent negatives for hypoglycemia include no confusion, headaches, nervousness/anxiousness, pallor, seizures or tremors. Associated symptoms include blurred vision, fatigue, foot paresthesias,  polydipsia and polyuria. Pertinent negatives for diabetes include no chest pain, no polyphagia, no weakness and no weight loss. There are no hypoglycemic complications. Symptoms are stable. Diabetic complications include a CVA, heart disease, nephropathy and peripheral neuropathy. Risk factors for coronary artery disease include male sex, obesity, sedentary lifestyle, tobacco exposure, family history, diabetes mellitus, hypertension and dyslipidemia. Current diabetic treatment includes intensive insulin program and oral agent (monotherapy). He is compliant with treatment all of the time (with assistance from assisted living facility staff). His weight is decreasing steadily. He is following a generally unhealthy diet. When asked about meal planning, he reported none. He has not had a previous visit with a dietitian. He never participates in exercise. His home blood glucose trend is increasing steadily. His breakfast blood glucose range is generally 180-200 mg/dl. His lunch blood glucose range is generally >200 mg/dl. His dinner blood glucose range is generally >200 mg/dl. His bedtime blood glucose range is generally >200 mg/dl. His overall blood glucose range is >200 mg/dl. (He presents today, accompanied by his care attendant from the nursing home, with logs showing persistent hyperglycemia, both fasting and postprandially.  His POCT A1c today is 9.5%, increasing from last visit of 8.8%.  Per his report, he eats what he wants, wants to enjoy the life he has left and the facility cannot do much to limit his carbs.  There are no documented or reported episodes of hypoglycemia.) An ACE inhibitor/angiotensin II receptor blocker is being taken. He sees a podiatrist (Podiatrist comes to ALF  periodically).Eye exam is current.  Thyroid Problem Presents for follow-up visit. Onset time: Patient does not recall the circumstance of his diagnosis with hypothyroidism. Symptoms include fatigue. Patient reports no anxiety, cold intolerance, constipation, depressed mood, diarrhea, heat intolerance, palpitations, tremors, weight gain or weight loss. The symptoms have been stable. Past treatments include levothyroxine (Is currently on levothyroxine 125 mg p.o. daily.). The following procedures have not been performed: radioiodine uptake scan and thyroidectomy. His past medical history is significant for diabetes, heart failure and obesity.  Hypertension This is a chronic problem. The current episode started more than 1 year ago. The problem is unchanged. The problem is controlled. Associated symptoms include blurred vision. Pertinent negatives include no chest pain, headaches, neck pain, palpitations or shortness of breath. Agents associated with hypertension include thyroid hormones. Risk factors for coronary artery disease include diabetes mellitus, male gender, obesity, sedentary lifestyle, smoking/tobacco exposure, family history and dyslipidemia. Past treatments include ACE inhibitors and diuretics. The current treatment provides mild improvement. There are no compliance problems.  Hypertensive end-organ damage includes kidney disease, CAD/MI, CVA and heart failure. Identifiable causes of hypertension include chronic renal disease and a thyroid problem.    Review of systems  Constitutional: + Minimally fluctuating body weight,  current Body mass index is 34.56 kg/m. , + fatigue, no subjective hyperthermia, no subjective hypothermia Eyes: + blurry vision- improving, no xerophthalmia ENT: no sore throat, no nodules palpated in throat, no dysphagia/odynophagia, no hoarseness Cardiovascular: no chest pain, no shortness of breath, no palpitations, + leg swelling Respiratory: no cough, no shortness of  breath Gastrointestinal: no nausea/vomiting/diarrhea Musculoskeletal: no muscle/joint aches, wheelchair bound due to previous CVA, disequilibrium, deconditioning Skin: no rashes, no hyperemia Neurological: no tremors, no numbness, no tingling, no dizziness Psychiatric: no depression, no anxiety    Objective:    BP 125/76 (BP Location: Left Arm, Patient Position: Sitting)   Pulse 74   Wt 234 lb (106.1 kg)   BMI 34.56 kg/m  Wt Readings from Last 3 Encounters:  12/04/20 234 lb (106.1 kg)  08/15/20 247 lb (112 kg)  08/01/20 247 lb 12.8 oz (112.4 kg)    BP Readings from Last 3 Encounters:  12/04/20 125/76  08/15/20 (!) 149/80  08/01/20 (!) 142/62     Physical Exam- Limited  Constitutional:  Body mass index is 34.56 kg/m. , not in acute distress, flat affect Eyes:  EOMI, no exophthalmos Neck: Supple Cardiovascular: RRR, no murmers, rubs, or gallops, + BLE pitting edema  Respiratory: Adequate breathing efforts, no crackles, rales, rhonchi, or wheezing Musculoskeletal: no gross deformities, wheelchair bound due to previous CVA Skin:  no rashes, no hyperemia Neurological: no tremor with outstretched hands    CMP ( most recent) CMP     Component Value Date/Time   NA 142 11/13/2020 0000   K 4.3 11/13/2020 0000   CL 102 11/13/2020 0000   CO2 26 (A) 11/13/2020 0000   GLUCOSE 165 (H) 04/19/2020 1746   BUN 19 11/13/2020 0000   CREATININE 1.1 11/13/2020 0000   CREATININE 0.82 04/19/2020 1746   CALCIUM 9.2 11/13/2020 0000   ALBUMIN 4.0 11/13/2020 0000   AST 16 11/13/2020 0000   ALT 9 (A) 11/13/2020 0000   ALKPHOS 37 11/13/2020 0000   GFRNONAA 67 11/13/2020 0000   GFRAA 77 11/13/2020 0000     Diabetic Labs (most recent): Lab Results  Component Value Date   HGBA1C 9.5 (A) 12/04/2020   HGBA1C 8.8 (A) 08/01/2020   HGBA1C 8.3 03/20/2020     Lab Results  Component Value Date   TSH 2.74 11/13/2020   TSH 1.70 03/20/2020      Assessment & Plan:   1) Type 2  diabetes mellitus without complication, with long-term current use   - John Kirk has currently uncontrolled symptomatic type 2 DM since 80 years of age.   Recent labs reviewed.  He presents today, accompanied by his care attendant from the nursing home, with logs showing persistent hyperglycemia, both fasting and postprandially.  His POCT A1c today is 9.5%, increasing from last visit of 8.8%.  Per his report, he eats what he wants, wants to enjoy the life he has left and the facility cannot do much to limit his carbs.  There are no documented or reported episodes of hypoglycemia.  - I had a long discussion with him about the progressive nature of diabetes and the pathology behind its complications. -his diabetes is complicated by CVA, obesity/sedentary life and he remains at a high risk for more acute and chronic complications which include CAD, CVA, CKD, retinopathy, and neuropathy. These are all discussed in detail with him.  - Nutritional counseling repeated at each appointment due to patients tendency to fall back in to old habits.  - The patient admits there is a room for improvement in their diet and drink choices. -  Suggestion is made for the patient to avoid simple carbohydrates from their diet including Cakes, Sweet Desserts / Pastries, Ice Cream, Soda (diet and regular), Sweet Tea, Candies, Chips, Cookies, Sweet Pastries,  Store Bought Juices, Alcohol in Excess of  1-2 drinks a day, Artificial Sweeteners, Coffee Creamer, and "Sugar-free" Products. This will help patient to have stable blood glucose profile and potentially avoid unintended weight gain.   - I encouraged the patient to switch to  unprocessed or minimally processed complex starch and increased protein intake (animal or plant source), fruits, and vegetables.   - Patient is advised to stick to a routine mealtimes  to eat 3 meals  a day and avoid unnecessary snacks ( to snack only to correct hypoglycemia).  - I have  approached him with the following individualized plan to manage  his diabetes and patient agrees:   -In light of his chronic glycemic burden, he will need intensive treatment with basal/bolus insulin in order for him to achieve and maintain control of diabetes to target.  -He will tolerate slight increase in his Levemir to 60 units SQ daily at bedtime, increase his Novolog to 12-18 units TID with meals if glucose is above 90 and he is eating.  Specific instructions on how to titrate insulin dose based on glucose readings given to patient/aide in writing.  He is also advised to continue Metformin 500 mg ER daily with breakfast.   -He is encouraged to continue monitoring glucose 4 times daily, before meals and before bed, and call the clinic if he has readings less than 70 or greater than 300 for 3 tests in a row.  - Specific targets for  A1c;  LDL, HDL,  and Triglycerides were discussed with the patient.  2) Blood Pressure /Hypertension:   his blood pressure is controlled to target.   he is advised to continue his current medications including Lisinopril 10 mg p.o. daily with breakfast and Lasix 40 mg po daily.  3) Lipids/Hyperlipidemia:  His recent lipid panel from 11/04/20 shows controlled LDL of 75 and slightly elevated triglycerides of 156.  He is not currently on any lipid lowering medications (I suspect may be as a result of advanced age and need to simplify medication regimen due to risk of polypharmacy).   4)  Weight/Diet:  His Body mass index is 34.56 kg/m.  -   clearly complicating his diabetes care.   he is  a candidate for weight loss. I discussed with him the fact that loss of 5 - 10% of his  current body weight will have the most impact on his diabetes management.  Exercise, and detailed carbohydrates information provided  -  detailed on discharge instructions.  5) Hypothyroidism -His previsit thyroid function tests are consistent with appropriate hormone replacement.  He is advised  to continue Levothyroxine 125 mcg po daily before breakfast.    - We discussed about the correct intake of his thyroid hormone, on empty stomach at fasting, with water, separated by at least 30 minutes from breakfast and other medications,  and separated by more than 4 hours from calcium, iron, multivitamins, acid reflux medications (PPIs). -Patient is made aware of the fact that thyroid hormone replacement is needed for life, dose to be adjusted by periodic monitoring of thyroid function tests.  6) Chronic Care/Health Maintenance: -he is on ACEI medications and is encouraged to initiate and continue to follow up with Ophthalmology, Dentist,  Podiatrist at least yearly or according to recommendations, and advised to  stay away from smoking. I have recommended yearly flu vaccine and pneumonia vaccine at least every 5 years; moderate intensity exercise for up to 150 minutes weekly; and  sleep for at least 7 hours a day.  - he is  advised to maintain close follow up with Dettinger, Elige Radon, MD for primary care needs, as well as his other providers for optimal and coordinated care.   - Time spent on this patient care encounter:  40 min, of which > 50% was spent in  counseling and the rest reviewing his blood glucose logs , discussing his hypoglycemia and hyperglycemia episodes, reviewing his current and  previous labs / studies  ( including abstraction from other facilities) and medications  doses and developing a  long term treatment plan and documenting his care.   Please refer to Patient Instructions for Blood Glucose Monitoring and Insulin/Medications Dosing Guide"  in media tab for additional information. Please  also refer to " Patient Self Inventory" in the Media  tab for reviewed elements of pertinent patient history.  Hinton Dyer participated in the discussions, expressed understanding, and voiced agreement with the above plans.  All questions were answered to his satisfaction. he is  encouraged to contact clinic should he have any questions or concerns prior to his return visit.   Follow up plan: - Return in about 3 months (around 03/03/2021) for Diabetes follow up with A1c in office, No previsit labs, Bring glucometer and logs.  Ronny Bacon, [facility] [address] [place], [place] [address] Phone: [phone] Fax: [phone]  12/04/2020, 11:08 AM

## 2020-12-06 ENCOUNTER — Ambulatory Visit: Payer: Medicare Other | Admitting: Family Medicine

## 2020-12-18 DIAGNOSIS — I509 Heart failure, unspecified: Secondary | ICD-10-CM | POA: Diagnosis not present

## 2020-12-18 DIAGNOSIS — I4891 Unspecified atrial fibrillation: Secondary | ICD-10-CM | POA: Diagnosis not present

## 2020-12-18 DIAGNOSIS — E1165 Type 2 diabetes mellitus with hyperglycemia: Secondary | ICD-10-CM | POA: Diagnosis not present

## 2020-12-18 DIAGNOSIS — J449 Chronic obstructive pulmonary disease, unspecified: Secondary | ICD-10-CM | POA: Diagnosis not present

## 2020-12-18 DIAGNOSIS — L539 Erythematous condition, unspecified: Secondary | ICD-10-CM | POA: Diagnosis not present

## 2020-12-18 DIAGNOSIS — Z794 Long term (current) use of insulin: Secondary | ICD-10-CM | POA: Diagnosis not present

## 2020-12-18 DIAGNOSIS — R600 Localized edema: Secondary | ICD-10-CM | POA: Diagnosis not present

## 2020-12-18 DIAGNOSIS — I110 Hypertensive heart disease with heart failure: Secondary | ICD-10-CM | POA: Diagnosis not present

## 2020-12-18 DIAGNOSIS — E039 Hypothyroidism, unspecified: Secondary | ICD-10-CM | POA: Diagnosis not present

## 2020-12-18 DIAGNOSIS — M1990 Unspecified osteoarthritis, unspecified site: Secondary | ICD-10-CM | POA: Diagnosis not present

## 2020-12-20 DIAGNOSIS — R948 Abnormal results of function studies of other organs and systems: Secondary | ICD-10-CM | POA: Diagnosis not present

## 2020-12-20 DIAGNOSIS — B351 Tinea unguium: Secondary | ICD-10-CM | POA: Diagnosis not present

## 2020-12-20 DIAGNOSIS — R600 Localized edema: Secondary | ICD-10-CM | POA: Diagnosis not present

## 2020-12-20 DIAGNOSIS — L84 Corns and callosities: Secondary | ICD-10-CM | POA: Diagnosis not present

## 2020-12-20 DIAGNOSIS — L539 Erythematous condition, unspecified: Secondary | ICD-10-CM | POA: Diagnosis not present

## 2020-12-20 DIAGNOSIS — R0989 Other specified symptoms and signs involving the circulatory and respiratory systems: Secondary | ICD-10-CM | POA: Diagnosis not present

## 2020-12-20 DIAGNOSIS — M79672 Pain in left foot: Secondary | ICD-10-CM | POA: Diagnosis not present

## 2020-12-20 DIAGNOSIS — U071 COVID-19: Secondary | ICD-10-CM | POA: Diagnosis not present

## 2020-12-20 DIAGNOSIS — E119 Type 2 diabetes mellitus without complications: Secondary | ICD-10-CM | POA: Diagnosis not present

## 2020-12-23 DIAGNOSIS — I4891 Unspecified atrial fibrillation: Secondary | ICD-10-CM | POA: Diagnosis not present

## 2020-12-23 DIAGNOSIS — J9611 Chronic respiratory failure with hypoxia: Secondary | ICD-10-CM | POA: Diagnosis not present

## 2020-12-23 DIAGNOSIS — J449 Chronic obstructive pulmonary disease, unspecified: Secondary | ICD-10-CM | POA: Diagnosis not present

## 2020-12-23 DIAGNOSIS — R600 Localized edema: Secondary | ICD-10-CM | POA: Diagnosis not present

## 2020-12-23 DIAGNOSIS — E1165 Type 2 diabetes mellitus with hyperglycemia: Secondary | ICD-10-CM | POA: Diagnosis not present

## 2020-12-23 DIAGNOSIS — Z7984 Long term (current) use of oral hypoglycemic drugs: Secondary | ICD-10-CM | POA: Diagnosis not present

## 2020-12-23 DIAGNOSIS — Z794 Long term (current) use of insulin: Secondary | ICD-10-CM | POA: Diagnosis not present

## 2020-12-23 DIAGNOSIS — I110 Hypertensive heart disease with heart failure: Secondary | ICD-10-CM | POA: Diagnosis not present

## 2020-12-23 DIAGNOSIS — I509 Heart failure, unspecified: Secondary | ICD-10-CM | POA: Diagnosis not present

## 2020-12-23 DIAGNOSIS — I2510 Atherosclerotic heart disease of native coronary artery without angina pectoris: Secondary | ICD-10-CM | POA: Diagnosis not present

## 2020-12-27 DIAGNOSIS — I4891 Unspecified atrial fibrillation: Secondary | ICD-10-CM | POA: Diagnosis not present

## 2020-12-27 DIAGNOSIS — I509 Heart failure, unspecified: Secondary | ICD-10-CM | POA: Diagnosis not present

## 2020-12-27 DIAGNOSIS — R600 Localized edema: Secondary | ICD-10-CM | POA: Diagnosis not present

## 2020-12-27 DIAGNOSIS — Z794 Long term (current) use of insulin: Secondary | ICD-10-CM | POA: Diagnosis not present

## 2020-12-27 DIAGNOSIS — J9611 Chronic respiratory failure with hypoxia: Secondary | ICD-10-CM | POA: Diagnosis not present

## 2020-12-27 DIAGNOSIS — I110 Hypertensive heart disease with heart failure: Secondary | ICD-10-CM | POA: Diagnosis not present

## 2020-12-27 DIAGNOSIS — J449 Chronic obstructive pulmonary disease, unspecified: Secondary | ICD-10-CM | POA: Diagnosis not present

## 2020-12-27 DIAGNOSIS — Z7984 Long term (current) use of oral hypoglycemic drugs: Secondary | ICD-10-CM | POA: Diagnosis not present

## 2020-12-27 DIAGNOSIS — I2510 Atherosclerotic heart disease of native coronary artery without angina pectoris: Secondary | ICD-10-CM | POA: Diagnosis not present

## 2020-12-27 DIAGNOSIS — E1165 Type 2 diabetes mellitus with hyperglycemia: Secondary | ICD-10-CM | POA: Diagnosis not present

## 2020-12-30 DIAGNOSIS — I110 Hypertensive heart disease with heart failure: Secondary | ICD-10-CM | POA: Diagnosis not present

## 2020-12-30 DIAGNOSIS — I2510 Atherosclerotic heart disease of native coronary artery without angina pectoris: Secondary | ICD-10-CM | POA: Diagnosis not present

## 2020-12-30 DIAGNOSIS — J9611 Chronic respiratory failure with hypoxia: Secondary | ICD-10-CM | POA: Diagnosis not present

## 2020-12-30 DIAGNOSIS — I509 Heart failure, unspecified: Secondary | ICD-10-CM | POA: Diagnosis not present

## 2020-12-30 DIAGNOSIS — E1165 Type 2 diabetes mellitus with hyperglycemia: Secondary | ICD-10-CM | POA: Diagnosis not present

## 2020-12-30 DIAGNOSIS — R600 Localized edema: Secondary | ICD-10-CM | POA: Diagnosis not present

## 2020-12-30 DIAGNOSIS — Z7984 Long term (current) use of oral hypoglycemic drugs: Secondary | ICD-10-CM | POA: Diagnosis not present

## 2020-12-30 DIAGNOSIS — Z794 Long term (current) use of insulin: Secondary | ICD-10-CM | POA: Diagnosis not present

## 2020-12-30 DIAGNOSIS — J449 Chronic obstructive pulmonary disease, unspecified: Secondary | ICD-10-CM | POA: Diagnosis not present

## 2020-12-30 DIAGNOSIS — I4891 Unspecified atrial fibrillation: Secondary | ICD-10-CM | POA: Diagnosis not present

## 2020-12-31 ENCOUNTER — Emergency Department (HOSPITAL_COMMUNITY): Payer: Medicare Other

## 2020-12-31 ENCOUNTER — Emergency Department (HOSPITAL_COMMUNITY)
Admission: EM | Admit: 2020-12-31 | Discharge: 2020-12-31 | Disposition: A | Discharge status: Home / Self Care | Payer: Medicare Other | Attending: Emergency Medicine | Admitting: Emergency Medicine

## 2020-12-31 ENCOUNTER — Other Ambulatory Visit: Payer: Self-pay

## 2020-12-31 ENCOUNTER — Encounter (HOSPITAL_COMMUNITY): Payer: Self-pay | Admitting: Emergency Medicine

## 2020-12-31 DIAGNOSIS — M419 Scoliosis, unspecified: Secondary | ICD-10-CM | POA: Diagnosis not present

## 2020-12-31 DIAGNOSIS — M5137 Other intervertebral disc degeneration, lumbosacral region: Secondary | ICD-10-CM | POA: Diagnosis not present

## 2020-12-31 DIAGNOSIS — Z7984 Long term (current) use of oral hypoglycemic drugs: Secondary | ICD-10-CM | POA: Diagnosis not present

## 2020-12-31 DIAGNOSIS — W19XXXA Unspecified fall, initial encounter: Secondary | ICD-10-CM | POA: Diagnosis not present

## 2020-12-31 DIAGNOSIS — I4891 Unspecified atrial fibrillation: Secondary | ICD-10-CM | POA: Diagnosis not present

## 2020-12-31 DIAGNOSIS — E039 Hypothyroidism, unspecified: Secondary | ICD-10-CM | POA: Insufficient documentation

## 2020-12-31 DIAGNOSIS — J449 Chronic obstructive pulmonary disease, unspecified: Secondary | ICD-10-CM | POA: Diagnosis not present

## 2020-12-31 DIAGNOSIS — Z7901 Long term (current) use of anticoagulants: Secondary | ICD-10-CM | POA: Insufficient documentation

## 2020-12-31 DIAGNOSIS — S3992XA Unspecified injury of lower back, initial encounter: Secondary | ICD-10-CM | POA: Diagnosis present

## 2020-12-31 DIAGNOSIS — E119 Type 2 diabetes mellitus without complications: Secondary | ICD-10-CM | POA: Insufficient documentation

## 2020-12-31 DIAGNOSIS — Z79899 Other long term (current) drug therapy: Secondary | ICD-10-CM | POA: Insufficient documentation

## 2020-12-31 DIAGNOSIS — G8929 Other chronic pain: Secondary | ICD-10-CM | POA: Diagnosis not present

## 2020-12-31 DIAGNOSIS — Z87891 Personal history of nicotine dependence: Secondary | ICD-10-CM | POA: Insufficient documentation

## 2020-12-31 DIAGNOSIS — R531 Weakness: Secondary | ICD-10-CM | POA: Diagnosis not present

## 2020-12-31 DIAGNOSIS — I110 Hypertensive heart disease with heart failure: Secondary | ICD-10-CM | POA: Insufficient documentation

## 2020-12-31 DIAGNOSIS — I509 Heart failure, unspecified: Secondary | ICD-10-CM | POA: Diagnosis not present

## 2020-12-31 DIAGNOSIS — M533 Sacrococcygeal disorders, not elsewhere classified: Secondary | ICD-10-CM | POA: Diagnosis not present

## 2020-12-31 DIAGNOSIS — Z981 Arthrodesis status: Secondary | ICD-10-CM | POA: Diagnosis not present

## 2020-12-31 DIAGNOSIS — M542 Cervicalgia: Secondary | ICD-10-CM | POA: Diagnosis not present

## 2020-12-31 DIAGNOSIS — R519 Headache, unspecified: Secondary | ICD-10-CM | POA: Diagnosis not present

## 2020-12-31 DIAGNOSIS — R202 Paresthesia of skin: Secondary | ICD-10-CM | POA: Diagnosis not present

## 2020-12-31 DIAGNOSIS — M546 Pain in thoracic spine: Secondary | ICD-10-CM | POA: Diagnosis not present

## 2020-12-31 DIAGNOSIS — I708 Atherosclerosis of other arteries: Secondary | ICD-10-CM | POA: Diagnosis not present

## 2020-12-31 DIAGNOSIS — Z743 Need for continuous supervision: Secondary | ICD-10-CM | POA: Diagnosis not present

## 2020-12-31 DIAGNOSIS — M47814 Spondylosis without myelopathy or radiculopathy, thoracic region: Secondary | ICD-10-CM | POA: Diagnosis not present

## 2020-12-31 DIAGNOSIS — Z794 Long term (current) use of insulin: Secondary | ICD-10-CM | POA: Insufficient documentation

## 2020-12-31 DIAGNOSIS — S3210XA Unspecified fracture of sacrum, initial encounter for closed fracture: Secondary | ICD-10-CM | POA: Diagnosis not present

## 2020-12-31 DIAGNOSIS — R296 Repeated falls: Secondary | ICD-10-CM | POA: Diagnosis not present

## 2020-12-31 DIAGNOSIS — W010XXA Fall on same level from slipping, tripping and stumbling without subsequent striking against object, initial encounter: Secondary | ICD-10-CM | POA: Insufficient documentation

## 2020-12-31 DIAGNOSIS — M4316 Spondylolisthesis, lumbar region: Secondary | ICD-10-CM | POA: Diagnosis not present

## 2020-12-31 DIAGNOSIS — M2578 Osteophyte, vertebrae: Secondary | ICD-10-CM | POA: Diagnosis not present

## 2020-12-31 LAB — CBC WITH DIFFERENTIAL/PLATELET
Abs Immature Granulocytes: 0.02 K/uL (ref 0.00–0.07)
Basophils Absolute: 0 K/uL (ref 0.0–0.1)
Basophils Relative: 1 %
Eosinophils Absolute: 0.1 K/uL (ref 0.0–0.5)
Eosinophils Relative: 2 %
HCT: 46.3 % (ref 39.0–52.0)
Hemoglobin: 14.8 g/dL (ref 13.0–17.0)
Immature Granulocytes: 1 %
Lymphocytes Relative: 43 %
Lymphs Abs: 1.8 K/uL (ref 0.7–4.0)
MCH: 30.5 pg (ref 26.0–34.0)
MCHC: 32 g/dL (ref 30.0–36.0)
MCV: 95.3 fL (ref 80.0–100.0)
Monocytes Absolute: 0.3 K/uL (ref 0.1–1.0)
Monocytes Relative: 7 %
Neutro Abs: 2 K/uL (ref 1.7–7.7)
Neutrophils Relative %: 46 %
Platelets: 105 K/uL — ABNORMAL LOW (ref 150–400)
RBC: 4.86 MIL/uL (ref 4.22–5.81)
RDW: 13.5 % (ref 11.5–15.5)
WBC: 4.2 K/uL (ref 4.0–10.5)
nRBC: 0 % (ref 0.0–0.2)

## 2020-12-31 LAB — COMPREHENSIVE METABOLIC PANEL WITH GFR
ALT: 13 U/L (ref 0–44)
AST: 17 U/L (ref 15–41)
Albumin: 3.8 g/dL (ref 3.5–5.0)
Alkaline Phosphatase: 28 U/L — ABNORMAL LOW (ref 38–126)
Anion gap: 9 (ref 5–15)
BUN: 19 mg/dL (ref 8–23)
CO2: 32 mmol/L (ref 22–32)
Calcium: 8.9 mg/dL (ref 8.9–10.3)
Chloride: 100 mmol/L (ref 98–111)
Creatinine, Ser: 0.97 mg/dL (ref 0.61–1.24)
GFR, Estimated: >60 mL/min
Glucose, Bld: 115 mg/dL — ABNORMAL HIGH (ref 70–99)
Potassium: 3.7 mmol/L (ref 3.5–5.1)
Sodium: 141 mmol/L (ref 135–145)
Total Bilirubin: 0.6 mg/dL (ref 0.3–1.2)
Total Protein: 6.9 g/dL (ref 6.5–8.1)

## 2020-12-31 LAB — URINALYSIS, ROUTINE W REFLEX MICROSCOPIC
Bilirubin Urine: NEGATIVE
Glucose, UA: NEGATIVE mg/dL
Hgb urine dipstick: NEGATIVE
Ketones, ur: NEGATIVE mg/dL
Leukocytes,Ua: NEGATIVE
Nitrite: NEGATIVE
Protein, ur: NEGATIVE mg/dL
Specific Gravity, Urine: 1.017 (ref 1.005–1.030)
pH: 7 (ref 5.0–8.0)

## 2020-12-31 LAB — TROPONIN I (HIGH SENSITIVITY)
Troponin I (High Sensitivity): 22 ng/L — ABNORMAL HIGH (ref <18)
Troponin I (High Sensitivity): 20 ng/L — ABNORMAL HIGH

## 2020-12-31 MED ORDER — FENTANYL CITRATE (PF) 100 MCG/2ML IJ SOLN
100.0000 ug | Freq: Once | INTRAMUSCULAR | Status: AC
  Administered 2020-12-31: 100 ug via INTRAVENOUS
  Filled 2020-12-31: qty 2

## 2020-12-31 MED ORDER — OXYCODONE-ACETAMINOPHEN 5-325 MG PO TABS
1.0000 | ORAL_TABLET | Freq: Once | ORAL | Status: AC
  Administered 2020-12-31: 1 via ORAL
  Filled 2020-12-31: qty 1

## 2020-12-31 MED ORDER — OXYCODONE-ACETAMINOPHEN 5-325 MG PO TABS: 1.0000 | ORAL_TABLET | Freq: Four times a day (QID) | ORAL | 0 refills | Status: DC | PRN

## 2020-12-31 NOTE — ED Triage Notes (Signed)
Fell yesterday and today.  Denies hitting head.  C/o weakness in legs.  Uses walker and w/c.  C/o lumbar pain, history of back sx.  C/o body pain 10/10.

## 2020-12-31 NOTE — Discharge Instructions (Signed)
If you develop worsening, recurrent, or continued back pain, numbness or weakness in the legs, incontinence of your bowels or bladders, numbness of your buttocks, fever, abdominal pain, or any other new/concerning symptoms then return to the ER for evaluation.  

## 2020-12-31 NOTE — ED Provider Notes (Signed)
[facility] EMERGENCY DEPARTMENT Provider Note   CSN: [id number] Arrival date & time: 12/31/20  3086     History Chief Complaint  Patient presents with  . Fall    GEOFREY SILLIMAN is a 80 y.o. male.  HPI 80 year old male presents with a fall and back pain.  Patient states he has fallen twice in the last 2 days, both because of slipping.  He has a lot of chronic weakness and numbness from prior strokes.  In particular his left side is weaker than his right but he also has chronic numbness in his legs.  Larey Seat and injured his coccyx a couple days ago and then fell again last night.  Is having severe pain, mostly mid to lower back.  He is not sure if he hit his head but he does not think he lost consciousness and never felt like he was lightheaded or having syncope.  Did have some chest pain upon arrival to the ER that has quickly resolved but he was concerned about his heart.  He states the numbness in his leg seems to be stronger since the fall.  Because of his comorbidities he chronically is able to stand and sometimes pivot but he typically does not walk besides a few steps.  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   Past Medical History:  Diagnosis Date  . 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 Atrial fibrillation (HCC)   . CHF (congestive heart failure) (HCC)   . COPD (chronic obstructive pulmonary disease) (HCC)   . CVA (cerebral vascular accident) (HCC)   . DM2 (diabetes mellitus, type 2) (HCC)   . Hypertension   . Hypothyroidism   . Pneumonia     Patient Active Problem List   Diagnosis Date Noted  . Type 2 diabetes mellitus without complication, with long-term current use of insulin (HCC) 08/01/2020  . Hypothyroidism 08/01/2020    Past Surgical History:  Procedure Laterality Date  . CHOLECYSTECTOMY    . LUMBAR LAMINECTOMY     x 2  . NEPHROLITHOTOMY Right        Family History  Problem Relation Age of Onset  . Diabetes Mother   .  Thyroid disease Mother   . Hypertension Mother   . Hyperlipidemia Mother   . Heart attack Mother   . Cancer Mother   . Hypertension Father   . Cancer Father     Social History   Tobacco Use  . Smoking status: Former Smoker    Years: 20.00  . Smokeless tobacco: Former Neurosurgeon    Types: Chew  . Tobacco comment: smoked from 23yr to 26 yr  Vaping Use  . Vaping Use: Never used  Substance Use Topics  . Alcohol use: Never  . Drug use: Never    Home Medications Prior to Admission medications   Medication Sig Start Date End Date Taking? Authorizing Provider  acetaminophen (TYLENOL) 500 MG tablet Take 1,000 mg by mouth in the morning.   Yes [provider]  albuterol (VENTOLIN HFA) 108 (90 Base) MCG/ACT inhaler Inhale 2 puffs into the lungs in the morning, at noon, and at bedtime. *May use every 4 hours as needed for wheezing   Yes [provider]  allopurinol (ZYLOPRIM) 100 MG tablet Take 100 mg by mouth daily.   Yes [provider]  ALPRAZolam (XANAX) 0.25 MG tablet Take 0.25 mg by mouth 2 (two) times daily as needed for anxiety. 06/04/20  Yes [provider]  apixaban (ELIQUIS) 5 MG TABS tablet Take 5 mg by mouth 2 (two) times daily.   Yes [provider]  ARIPiprazole (ABILIFY) 2 MG tablet Take 2 mg by mouth in the morning.   Yes [provider]  bisacodyl (DULCOLAX) 10 MG suppository Place 10 mg rectally daily as needed for moderate constipation.   Yes [provider]  Cholecalciferol (VITAMIN D3) 1.25 MG (50000 UT) CAPS Take 1 capsule by mouth once a week.   Yes [provider]  divalproex (DEPAKOTE) 125 MG DR tablet Take 125 mg by mouth every 12 (twelve) hours.   Yes [provider]  DULoxetine (CYMBALTA) 60 MG capsule Take 60 mg by mouth daily. 06/10/20  Yes [provider]  fenofibrate (TRICOR) 145 MG tablet Take 145 mg by mouth daily. 11/29/20  Yes [provider]  furosemide (LASIX) 20  MG tablet Take 40 mg by mouth in the morning.    Yes [provider]  gabapentin (NEURONTIN) 100 MG capsule Take 100 mg by mouth in the morning and at bedtime.   Yes [provider]  hydrocortisone 2.5 % cream Apply 1 application topically daily at 6 (six) AM. Sparingly  to face/neck every evening   Yes [provider]  insulin aspart (NOVOLOG FLEXPEN) 100 UNIT/ML FlexPen Inject 12-18 Units into the skin 3 (three) times daily before meals. 90-150= 12u, 151-200= 13u, 201-250= 14u, 251-300= 15u, 301-350= 16u, 351-400= 17u, >400= 18u. Notify provider if less than 70 or greater than 400   Yes [provider]  insulin detemir (LEVEMIR FLEXTOUCH) 100 UNIT/ML FlexPen Inject 60 Units into the skin at bedtime. Prime pen with 2u  prior to each use. Once opened leave at room temp   Yes [provider]  isosorbide mononitrate (IMDUR) 30 MG 24 hr tablet Take 30 mg by mouth in the morning.   Yes [provider]  ketoconazole (NIZORAL) 2 % shampoo Apply 1 application topically 3 (three) times a week. Evern Bio, and Thurs 06/11/20  Yes [provider]  levETIRAcetam (KEPPRA) 500 MG tablet Take 500 mg by mouth 2 (two) times daily.   Yes [provider]  levothyroxine (SYNTHROID) 125 MCG tablet Take 125 mcg by mouth daily. 04/11/20  Yes [provider]  lisinopril (ZESTRIL) 10 MG tablet Take 10 mg by mouth daily. 06/09/20  Yes [provider]  MAGNESIUM OXIDE PO Take 500 mg by mouth 2 (two) times daily.    Yes [provider]  metFORMIN (GLUCOPHAGE-XR) 500 MG 24 hr tablet Take 500 mg by mouth every morning. 12/27/20  Yes [provider]  mirabegron ER (MYRBETRIQ) 50 MG TB24 tablet Take 1 tablet (50 mg total) by mouth daily. 06/28/20  Yes Bjorn Pippin, MD  Olopatadine HCl 0.2 % SOLN Place 2 drops into both eyes daily.   Yes [provider]  oxyCODONE-acetaminophen (PERCOCET) 5-325 MG tablet Take 1 tablet by mouth  every 6 (six) hours as needed for severe pain. 12/31/20  Yes Pricilla Loveless, MD  OXYGEN Inhale 2 L into the lungs continuous. At Bedtime   Yes [provider]  polyethylene glycol (MIRALAX / GLYCOLAX) 17 g packet Take 17 g by mouth daily. *Mixed in 4-8 ounces of water-drink   Yes [provider]  Propylene Glycol (SYSTANE COMPLETE) 0.6 % SOLN Place 1 drop into both eyes in the morning, at noon, in the evening, and at bedtime.   Yes [provider]  rosuvastatin (CRESTOR) 10 MG tablet Take 10  mg by mouth in the morning.   Yes [provider]  senna-docusate (SENOKOT-S) 8.6-50 MG tablet Take 2 tablets by mouth 2 (two) times daily.   Yes [provider]  simethicone (MYLICON) 80 MG chewable tablet Chew 80 mg by mouth in the morning.   Yes [provider]  tamsulosin (FLOMAX) 0.4 MG CAPS capsule Take 0.4 mg by mouth in the morning. *Give after the same meal   Yes [provider]  BD AUTOSHIELD DUO 30G X 5 MM MISC  06/19/20   [provider]  nitroGLYCERIN (NITROSTAT) 0.4 MG SL tablet Place 0.4 mg under the tongue every 5 (five) minutes as needed for chest pain.    [provider]    Allergies    Celery oil, Morphine and related, and Sulfa antibiotics  Review of Systems   Review of Systems  Cardiovascular: Positive for chest pain.  Gastrointestinal: Positive for abdominal pain (chronic, not new today).  Musculoskeletal: Positive for back pain and neck pain (chronic).  Neurological: Positive for weakness, numbness and headaches (chronic). Negative for light-headedness.  All other systems reviewed and are negative.   Physical Exam Updated Vital Signs BP (!) 110/56   Pulse (!) 56   Temp 98.2 F (36.8 C) (Oral)   Resp 20   Ht 5\' 9"  (1.753 m)   Wt 112.9 kg   SpO2 96%   BMI 36.77 kg/m   Physical Exam Vitals and nursing note reviewed.  Constitutional:      Appearance: He is well-developed. He is obese.   HENT:     Head: Normocephalic and atraumatic.     Right Ear: External ear normal.     Left Ear: External ear normal.     Nose: Nose normal.  Eyes:     General:        Right eye: No discharge.        Left eye: No discharge.     Extraocular Movements: Extraocular movements intact.     Pupils: Pupils are equal, round, and reactive to light.  Cardiovascular:     Rate and Rhythm: Normal rate and regular rhythm.     Heart sounds: Normal heart sounds.  Pulmonary:     Effort: Pulmonary effort is normal.     Breath sounds: Normal breath sounds.  Abdominal:     Palpations: Abdomen is soft.     Tenderness: There is no abdominal tenderness.  Musculoskeletal:     Cervical back: Neck supple. No tenderness.     Thoracic back: Tenderness present.     Lumbar back: Tenderness present.     Right hip: No deformity. Decreased range of motion.     Comments: There is no focal tenderness in RLE/right hip, but has severe pain when I try to range his leg. He keeps it straight and won't let me bend it. However, he states it causes both hip/back pain and can't localize  Skin:    General: Skin is warm and dry.  Neurological:     Mental Status: He is alert.     Comments: Mild left facial droop. Mild weakness in LUE compared to right. Can barely lift legs off stretcher bilaterally, but right side seems to be due to pain it causes in hip/back.  Psychiatric:        Mood and Affect: Mood is not anxious.     ED Results / Procedures / Treatments   Labs (all labs ordered are listed, but only abnormal results are displayed) Labs  Reviewed  COMPREHENSIVE METABOLIC PANEL - Abnormal; Notable for the following components:      Result Value   Glucose, Bld 115 (*)    Alkaline Phosphatase 28 (*)    All other components within normal limits  CBC WITH DIFFERENTIAL/PLATELET - Abnormal; Notable for the following components:   Platelets 105 (*)    All other components within normal limits  URINALYSIS, ROUTINE W  REFLEX MICROSCOPIC - Abnormal; Notable for the following components:   APPearance HAZY (*)    All other components within normal limits  TROPONIN I (HIGH SENSITIVITY) - Abnormal; Notable for the following components:   Troponin I (High Sensitivity) 22 (*)    All other components within normal limits  TROPONIN I (HIGH SENSITIVITY) - Abnormal; Notable for the following components:   Troponin I (High Sensitivity) 20 (*)    All other components within normal limits    EKG EKG Interpretation  Date/Time:  Tuesday December 31 2020 08:21:52 EDT Ventricular Rate:  54 PR Interval:    QRS Duration: 96 QT Interval:  434 QTC Calculation: 438 R Axis:   95 Text Interpretation: Atrial fibrillation Right axis deviation Borderline low voltage, extremity leads Confirmed by Pricilla Loveless [phone]) on 12/31/2020 9:30:59 AM   Radiology DG Thoracic Spine W/Swimmers  Result Date: 12/31/2020 CLINICAL DATA:  Fall.  Pain. EXAM: THORACIC SPINE - 3 VIEWS COMPARISON:  Cervical CT 12/31/2020 FINDINGS: Trachea shifted to the right. This is most likely secondary to left thyroid lobe enlargement/mass. Thyroid ultrasound suggested for further evaluation. Diffuse multilevel degenerative change with mild scoliosis concave left. No acute bony abnormality identified. Prior cervical spine fusion. Visualized hardware intact. IMPRESSION: 1. Diffuse multilevel degenerative change with mild scoliosis concave left. No acute bony abnormality identified. 2. Trachea shifted to the right. This is most likely secondary to left thyroid lobe enlargement/mass. Thyroid ultrasound suggested for further evaluation. Electronically Signed   By: Maisie Fus  Register   On: 12/31/2020 10:41   DG Lumbar Spine Complete  Result Date: 12/31/2020 CLINICAL DATA:  80 year old male status post falls in the past 2 days. EXAM: LUMBAR SPINE - COMPLETE 4+ VIEW COMPARISON:  Two-view chest radiographs 04/19/2020. FINDINGS: Normal lumbar segmentation. Lumbar vertebral  height is preserved. There is subtle anterolisthesis of L4 on L5 and retrolisthesis of L5 on S1 with associated moderate facet hypertrophy. Chronic L5-S1 disc space loss and endplate spurring. Visible lower thoracic levels appear grossly intact. No pars fracture. Grossly intact visible sacrum and SI joints. Sequelae of abdominal hernia repair with mesh. Calcified aortic atherosclerosis. IMPRESSION: 1. No acute osseous abnormality identified in the lumbar spine. 2. 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 3.  Aortic Atherosclerosis (ICD10-I70.0). Electronically Signed   By: Odessa Fleming M.D.   On: 12/31/2020 10:39   DG Sacrum/Coccyx  Result Date: 12/31/2020 CLINICAL DATA:  80 year old male status post falls over the past 2 days. EXAM: SACRUM AND COCCYX - 2+ VIEW COMPARISON:  Right hip and lumbar series today reported separately. FINDINGS: Degenerative sclerosis at the pubic symphysis. On the lateral view ventral deformity of the S5 sacral segment appears to be chronic. Elsewhere the sacrum and SI joints appear intact. Mild subluxation of the coccygeal segments. No acute fracture lucency identified. IMPRESSION: Age indeterminate deformity of the S5 sacral segment and coccyx, although suspected to be chronic. Still, if there is tailbone point tenderness than favor acute fractures. Electronically Signed   By: Odessa Fleming M.D.   On: 12/31/2020 10:42   CT Head Wo  Contrast  Result Date: 12/31/2020 CLINICAL DATA:  Fall yesterday with headaches and neck pain, initial encounter EXAM: CT HEAD WITHOUT CONTRAST CT CERVICAL SPINE WITHOUT CONTRAST TECHNIQUE: Multidetector CT imaging of the head and cervical spine was performed following the standard protocol without intravenous contrast. Multiplanar CT image reconstructions of the cervical spine were also generated. COMPARISON:  03/20/2020 FINDINGS: CT HEAD FINDINGS Brain: No evidence of acute infarction, hemorrhage, hydrocephalus,  extra-axial collection or mass lesion/mass effect. Mild atrophic and chronic white matter ischemic changes are noted. Small lacunar infarct is noted to the head of the caudate nucleus on the right. This is stable from the prior exam. Vascular: No hyperdense vessel or unexpected calcification. Skull: Normal. Negative for fracture or focal lesion. Sinuses/Orbits: No acute finding. Other: None. CT CERVICAL SPINE FINDINGS Alignment: Within normal limits. Skull base and vertebrae: 7 cervical segments are well visualized. Changes of prior fusion at C6-7 are noted with anterior fixation. Multilevel facet hypertrophic changes are noted. No acute fracture or acute facet abnormality is seen. Diffuse bilateral neural foraminal narrowing is noted. Soft tissues and spinal canal: Surrounding soft tissue structures are within normal limits. Upper chest: Visualized lung apices are within normal limits. Other: None IMPRESSION: CT of the head: Chronic atrophic and ischemic changes without acute intracranial abnormality. CT of the cervical spine: Multilevel degenerative change without acute bony abnormality. Electronically Signed   By: Alcide CleverMark  Lukens M.D.   On: 12/31/2020 10:26   CT Cervical Spine Wo Contrast  Result Date: 12/31/2020 CLINICAL DATA:  Fall yesterday with headaches and neck pain, initial encounter EXAM: CT HEAD WITHOUT CONTRAST CT CERVICAL SPINE WITHOUT CONTRAST TECHNIQUE: Multidetector CT imaging of the head and cervical spine was performed following the standard protocol without intravenous contrast. Multiplanar CT image reconstructions of the cervical spine were also generated. COMPARISON:  03/20/2020 FINDINGS: CT HEAD FINDINGS Brain: No evidence of acute infarction, hemorrhage, hydrocephalus, extra-axial collection or mass lesion/mass effect. Mild atrophic and chronic white matter ischemic changes are noted. Small lacunar infarct is noted to the head of the caudate nucleus on the right. This is stable from the  prior exam. Vascular: No hyperdense vessel or unexpected calcification. Skull: Normal. Negative for fracture or focal lesion. Sinuses/Orbits: No acute finding. Other: None. CT CERVICAL SPINE FINDINGS Alignment: Within normal limits. Skull base and vertebrae: 7 cervical segments are well visualized. Changes of prior fusion at C6-7 are noted with anterior fixation. Multilevel facet hypertrophic changes are noted. No acute fracture or acute facet abnormality is seen. Diffuse bilateral neural foraminal narrowing is noted. Soft tissues and spinal canal: Surrounding soft tissue structures are within normal limits. Upper chest: Visualized lung apices are within normal limits. Other: None IMPRESSION: CT of the head: Chronic atrophic and ischemic changes without acute intracranial abnormality. CT of the cervical spine: Multilevel degenerative change without acute bony abnormality. Electronically Signed   By: Alcide CleverMark  Lukens M.D.   On: 12/31/2020 10:26   DG Hip Unilat W or Wo Pelvis 2-3 Views Right  Result Date: 12/31/2020 CLINICAL DATA:  80 year old male status post falls over the past 2 days. EXAM: DG HIP (WITH OR WITHOUT PELVIS) 2-3V RIGHT COMPARISON:  Lumbar radiographs today reported separately. FINDINGS: Femoral heads are normally located. No pelvis fracture identified. SI joints appear within normal limits. Grossly intact proximal left femur. AP and lateral views of the proximal right femur. No fracture identified. Calcified femoral artery atherosclerosis. Negative visible lower abdominal and pelvic visceral contours. IMPRESSION: No acute osseous abnormality identified about the right hip  or pelvis. Electronically Signed   By: Odessa Fleming M.D.   On: 12/31/2020 10:40    Procedures Procedures   Medications Ordered in ED Medications  fentaNYL (SUBLIMAZE) injection 100 mcg (100 mcg Intravenous Given 12/31/20 0914)  oxyCODONE-acetaminophen (PERCOCET/ROXICET) 5-325 MG per tablet 1 tablet (1 tablet Oral Given 12/31/20  1128)    ED Course  I have reviewed the triage vital signs and the nursing notes.  Pertinent labs & imaging results that were available during my care of the patient were reviewed by me and considered in my medical decision making (see chart for details).    MDM Rules/Calculators/A&P                          Patient's work-up shows he probably has a sacral fracture.  Given the location of his pain this is probably new.  We will get him a pillow as well as some oral pain control.  He is feeling significantly better and moving his legs better.  Difficult to assess what is new and what is not based on his chronic neuro problems but it seems like he is back to baseline now after talking more to the patient.  He is able to stand and take a couple steps I doubt occult hip fracture.  My suspicion of acute spinal cord emergency is pretty low.  He did endorse some transient chest pain and so ECG and labs were obtained but the troponin is flat and only minimally elevated, which is probably more related to his chronic medical problems rather than ACS.  At this point he feels well enough for discharge back to the facility. Final Clinical Impression(s) / ED Diagnoses Final diagnoses:  Fall  Closed fracture of sacrum, unspecified portion of sacrum, initial encounter [facility])    Rx / DC Orders ED Discharge Orders         Ordered    oxyCODONE-acetaminophen (PERCOCET) 5-325 MG tablet  Every 6 hours PRN        12/31/20 1236           Pricilla Loveless, MD 12/31/20 1345

## 2020-12-31 NOTE — ED Notes (Signed)
When in to put on a clean pad patient "stated he only wore pulls and he was not changing and if he didn't mind the smell I shouldn't" . Advised nurse DM he refused get a clean pad

## 2021-01-01 DIAGNOSIS — D518 Other vitamin B12 deficiency anemias: Secondary | ICD-10-CM | POA: Diagnosis not present

## 2021-01-01 DIAGNOSIS — E559 Vitamin D deficiency, unspecified: Secondary | ICD-10-CM | POA: Diagnosis not present

## 2021-01-01 DIAGNOSIS — I509 Heart failure, unspecified: Secondary | ICD-10-CM | POA: Diagnosis not present

## 2021-01-01 DIAGNOSIS — E119 Type 2 diabetes mellitus without complications: Secondary | ICD-10-CM | POA: Diagnosis not present

## 2021-01-01 DIAGNOSIS — E1142 Type 2 diabetes mellitus with diabetic polyneuropathy: Secondary | ICD-10-CM | POA: Diagnosis not present

## 2021-01-01 DIAGNOSIS — I4891 Unspecified atrial fibrillation: Secondary | ICD-10-CM | POA: Diagnosis not present

## 2021-01-01 DIAGNOSIS — I10 Essential (primary) hypertension: Secondary | ICD-10-CM | POA: Diagnosis not present

## 2021-01-01 DIAGNOSIS — E1165 Type 2 diabetes mellitus with hyperglycemia: Secondary | ICD-10-CM | POA: Diagnosis not present

## 2021-01-01 DIAGNOSIS — I2510 Atherosclerotic heart disease of native coronary artery without angina pectoris: Secondary | ICD-10-CM | POA: Diagnosis not present

## 2021-01-01 DIAGNOSIS — J449 Chronic obstructive pulmonary disease, unspecified: Secondary | ICD-10-CM | POA: Diagnosis not present

## 2021-01-01 DIAGNOSIS — E038 Other specified hypothyroidism: Secondary | ICD-10-CM | POA: Diagnosis not present

## 2021-01-02 DIAGNOSIS — I4891 Unspecified atrial fibrillation: Secondary | ICD-10-CM | POA: Diagnosis not present

## 2021-01-02 DIAGNOSIS — R600 Localized edema: Secondary | ICD-10-CM | POA: Diagnosis not present

## 2021-01-02 DIAGNOSIS — Z794 Long term (current) use of insulin: Secondary | ICD-10-CM | POA: Diagnosis not present

## 2021-01-02 DIAGNOSIS — J449 Chronic obstructive pulmonary disease, unspecified: Secondary | ICD-10-CM | POA: Diagnosis not present

## 2021-01-02 DIAGNOSIS — Z7984 Long term (current) use of oral hypoglycemic drugs: Secondary | ICD-10-CM | POA: Diagnosis not present

## 2021-01-02 DIAGNOSIS — I509 Heart failure, unspecified: Secondary | ICD-10-CM | POA: Diagnosis not present

## 2021-01-02 DIAGNOSIS — J9611 Chronic respiratory failure with hypoxia: Secondary | ICD-10-CM | POA: Diagnosis not present

## 2021-01-02 DIAGNOSIS — I110 Hypertensive heart disease with heart failure: Secondary | ICD-10-CM | POA: Diagnosis not present

## 2021-01-02 DIAGNOSIS — E1165 Type 2 diabetes mellitus with hyperglycemia: Secondary | ICD-10-CM | POA: Diagnosis not present

## 2021-01-02 DIAGNOSIS — I2510 Atherosclerotic heart disease of native coronary artery without angina pectoris: Secondary | ICD-10-CM | POA: Diagnosis not present

## 2021-01-06 ENCOUNTER — Encounter: Payer: Self-pay | Admitting: Vascular Surgery

## 2021-01-06 ENCOUNTER — Ambulatory Visit (INDEPENDENT_AMBULATORY_CARE_PROVIDER_SITE_OTHER): Payer: Medicare Other | Admitting: Vascular Surgery

## 2021-01-06 ENCOUNTER — Other Ambulatory Visit: Payer: Self-pay

## 2021-01-06 VITALS — BP 110/71 | HR 66 | Temp 98.4°F | Resp 20 | Ht 69.0 in | Wt 247.0 lb

## 2021-01-06 DIAGNOSIS — Z7984 Long term (current) use of oral hypoglycemic drugs: Secondary | ICD-10-CM | POA: Diagnosis not present

## 2021-01-06 DIAGNOSIS — I4891 Unspecified atrial fibrillation: Secondary | ICD-10-CM | POA: Diagnosis not present

## 2021-01-06 DIAGNOSIS — M25561 Pain in right knee: Secondary | ICD-10-CM

## 2021-01-06 DIAGNOSIS — I509 Heart failure, unspecified: Secondary | ICD-10-CM | POA: Diagnosis not present

## 2021-01-06 DIAGNOSIS — I2510 Atherosclerotic heart disease of native coronary artery without angina pectoris: Secondary | ICD-10-CM | POA: Diagnosis not present

## 2021-01-06 DIAGNOSIS — E1165 Type 2 diabetes mellitus with hyperglycemia: Secondary | ICD-10-CM | POA: Diagnosis not present

## 2021-01-06 DIAGNOSIS — Z794 Long term (current) use of insulin: Secondary | ICD-10-CM | POA: Diagnosis not present

## 2021-01-06 DIAGNOSIS — J9611 Chronic respiratory failure with hypoxia: Secondary | ICD-10-CM | POA: Diagnosis not present

## 2021-01-06 DIAGNOSIS — M25562 Pain in left knee: Secondary | ICD-10-CM

## 2021-01-06 DIAGNOSIS — I110 Hypertensive heart disease with heart failure: Secondary | ICD-10-CM | POA: Diagnosis not present

## 2021-01-06 DIAGNOSIS — J449 Chronic obstructive pulmonary disease, unspecified: Secondary | ICD-10-CM | POA: Diagnosis not present

## 2021-01-06 DIAGNOSIS — R600 Localized edema: Secondary | ICD-10-CM | POA: Diagnosis not present

## 2021-01-06 NOTE — Progress Notes (Signed)
[facility] [place]  Patient name: John Kirk MRN: [id number] DOB: 03/19/1941 Sex: male  REASON FOR CONSULT: Evaluation lower extremity discomfort and swelling  HPI: John Kirk is a 80 y.o. male, who is here today for evaluation.  He is in a wheelchair.  His aide from his nursing facility is with him who is providing a great deal of the history.  He reports that he is "just barely alive" regarding his lower extremities he reports that his feet feel as if they are glued to the floor.  He does not have any tissue loss.  He does have significant bilateral lower extremity swelling.  He reports weakness and reports that he "hurts all over.  No specific pain in his lower extremities greater than the rest of his body.  He wishes he was able to walk more.  He is able to stand to transfer from bed to wheelchair  Past Medical History:  Diagnosis Date  . 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 Atrial fibrillation (HCC)   . CHF (congestive heart failure) (HCC)   . COPD (chronic obstructive pulmonary disease) (HCC)   . CVA (cerebral vascular accident) (HCC)   . DM2 (diabetes mellitus, type 2) (HCC)   . Hypertension   . Hypothyroidism   . Pneumonia     Family History  Problem Relation Age of Onset  . Diabetes Mother   . Thyroid disease Mother   . Hypertension Mother   . Hyperlipidemia Mother   . Heart attack Mother   . Cancer Mother   . Hypertension Father   . Cancer Father     SOCIAL HISTORY: Social History   Socioeconomic History  . Marital status: Widowed    Spouse name: Not on file  . Number of children: 1  . Years of education: Not on file  . Highest education level: Not on file  Occupational History  . Not on file  Tobacco Use  . Smoking status: Former Smoker    Years: 20.00  . Smokeless tobacco: Former Neurosurgeon    Types: Chew  . Tobacco comment: smoked from 56yr to 26 yr  Vaping Use   . Vaping Use: Never used  Substance and Sexual Activity  . Alcohol use: Never  . Drug use: Never  . Sexual activity: Not on file  Other Topics Concern  . Not on file  Social History Narrative  . Not on file   Social Determinants of Health   Financial Resource Strain: Not on file  Food Insecurity: Not on file  Transportation Needs: Not on file  Physical Activity: Not on file  Stress: Not on file  Social Connections: Not on file  Intimate Partner Violence: Not on file    Allergies  Allergen Reactions  . Celery Oil     Unknown-listed on MAR provided by facility  . Morphine And Related     Unknown-listed on MAR provided by facility  . Sulfa Antibiotics     Current Outpatient Medications  Medication Sig Dispense Refill  . acetaminophen (TYLENOL) 500 MG tablet Take 1,000 mg by mouth in the morning.    Marland Kitchen albuterol (VENTOLIN HFA) 108 (90 Base) MCG/ACT inhaler Inhale 2 puffs into the lungs in the morning, at noon, and at bedtime. *May use every 4 hours as needed for wheezing    . allopurinol (ZYLOPRIM) 100 MG tablet Take 100 mg by mouth daily.    Marland Kitchen ALPRAZolam (XANAX) 0.25 MG tablet Take  0.25 mg by mouth 2 (two) times daily as needed for anxiety.    Marland Kitchen apixaban (ELIQUIS) 5 MG TABS tablet Take 5 mg by mouth 2 (two) times daily.    . ARIPiprazole (ABILIFY) 2 MG tablet Take 2 mg by mouth in the morning.    . BD AUTOSHIELD DUO 30G X 5 MM MISC     . bisacodyl (DULCOLAX) 10 MG suppository Place 10 mg rectally daily as needed for moderate constipation.    . Cholecalciferol (VITAMIN D3) 1.25 MG (50000 UT) CAPS Take 1 capsule by mouth once a week.    . divalproex (DEPAKOTE) 125 MG DR tablet Take 125 mg by mouth every 12 (twelve) hours.    . DULoxetine (CYMBALTA) 60 MG capsule Take 60 mg by mouth daily.    . fenofibrate (TRICOR) 145 MG tablet Take 145 mg by mouth daily.    . furosemide (LASIX) 20 MG tablet Take 40 mg by mouth in the morning.     . gabapentin (NEURONTIN) 100 MG capsule Take  100 mg by mouth in the morning and at bedtime.    . hydrocortisone 2.5 % cream Apply 1 application topically daily at 6 (six) AM. Sparingly  to face/neck every evening    . insulin aspart (NOVOLOG FLEXPEN) 100 UNIT/ML FlexPen Inject 12-18 Units into the skin 3 (three) times daily before meals. 90-150= 12u, 151-200= 13u, 201-250= 14u, 251-300= 15u, 301-350= 16u, 351-400= 17u, >400= 18u. Notify provider if less than 70 or greater than 400    . insulin detemir (LEVEMIR FLEXTOUCH) 100 UNIT/ML FlexPen Inject 60 Units into the skin at bedtime. Prime pen with 2u  prior to each use. Once opened leave at room temp    . isosorbide mononitrate (IMDUR) 30 MG 24 hr tablet Take 30 mg by mouth in the morning.    Marland Kitchen ketoconazole (NIZORAL) 2 % shampoo Apply 1 application topically 3 (three) times a week. Evern Bio, and Thurs    . levETIRAcetam (KEPPRA) 500 MG tablet Take 500 mg by mouth 2 (two) times daily.    Marland Kitchen levothyroxine (SYNTHROID) 125 MCG tablet Take 125 mcg by mouth daily.    Marland Kitchen lisinopril (ZESTRIL) 10 MG tablet Take 10 mg by mouth daily.    Marland Kitchen MAGNESIUM OXIDE PO Take 500 mg by mouth 2 (two) times daily.     . metFORMIN (GLUCOPHAGE-XR) 500 MG 24 hr tablet Take 500 mg by mouth every morning.    . mirabegron ER (MYRBETRIQ) 50 MG TB24 tablet Take 1 tablet (50 mg total) by mouth daily. 28 tablet 0  . nitroGLYCERIN (NITROSTAT) 0.4 MG SL tablet Place 0.4 mg under the tongue every 5 (five) minutes as needed for chest pain.    Marland Kitchen Olopatadine HCl 0.2 % SOLN Place 2 drops into both eyes daily.    Marland Kitchen oxyCODONE-acetaminophen (PERCOCET) 5-325 MG tablet Take 1 tablet by mouth every 6 (six) hours as needed for severe pain. 10 tablet 0  . OXYGEN Inhale 2 L into the lungs continuous. At Bedtime    . polyethylene glycol (MIRALAX / GLYCOLAX) 17 g packet Take 17 g by mouth daily. *Mixed in 4-8 ounces of water-drink    . Propylene Glycol (SYSTANE COMPLETE) 0.6 % SOLN Place 1 drop into both eyes in the morning, at noon, in the  evening, and at bedtime.    . rosuvastatin (CRESTOR) 10 MG tablet Take 10 mg by mouth in the morning.    . senna-docusate (SENOKOT-S) 8.6-50 MG tablet Take 2 tablets by  mouth 2 (two) times daily.    . simethicone (MYLICON) 80 MG chewable tablet Chew 80 mg by mouth in the morning.    . tamsulosin (FLOMAX) 0.4 MG CAPS capsule Take 0.4 mg by mouth in the morning. *Give after the same meal     No current facility-administered medications for this visit.    REVIEW OF SYSTEMS:  [X]  denotes positive finding, [ ]  denotes negative finding Cardiac  Comments:  Chest pain or chest pressure:    Shortness of breath upon exertion:    Short of breath when lying flat:    Irregular heart rhythm:        Vascular    Pain in calf, thigh, or hip brought on by ambulation:    Pain in feet at night that wakes you up from your sleep:     Blood clot in your veins:    Leg swelling:  x       Pulmonary    Oxygen at home:    Productive cough:     Wheezing:         Neurologic    Sudden weakness in arms or legs:     Sudden numbness in arms or legs:     Sudden onset of difficulty speaking or slurred speech:    Temporary loss of vision in one eye:     Problems with dizziness:         Gastrointestinal    Blood in stool:     Vomited blood:         Genitourinary    Burning when urinating:     Blood in urine:        Psychiatric    Major depression:         Hematologic    Bleeding problems:    Problems with blood clotting too easily:        Skin    Rashes or ulcers:        Constitutional    Fever or chills:      PHYSICAL EXAM: Vitals:   01/06/21 1403  BP: 110/71  Pulse: 66  Resp: 20  Temp: 98.4 F (36.9 C)  TempSrc: Temporal  SpO2: 92%  Weight: 247 lb (112 kg)  Height: 5\' 9"  (1.753 m)    GENERAL: The patient is a well-nourished male, in no acute distress. The vital signs are documented above. CARDIOVASCULAR: 2+ radial pulses bilaterally.  2+ dorsalis pedis pulses bilaterally  Marked pitting edema in both lower extremities PULMONARY: There is good air exchange  MUSCULOSKELETAL: There are no major deformities or cyanosis. NEUROLOGIC: No focal weakness or paresthesias are detected. SKIN: There are no ulcers or rashes noted. PSYCHIATRIC: The patient has a normal affect.  DATA:  Noninvasive studies from [facility] were reviewed.  These are from 12/20/2020.  This shows noncompressible vessels.  He does have normal arterial waveforms in his feet bilaterally.  MEDICAL ISSUES: Discussed these findings with the patient.  Explained that he does not have any evidence of significant arterial insufficiency.  Unfortunately do not see any arterial issues to explain his eye discomfort.  He may benefit from increased diuresis for swelling if his renal function will tolerate this.  Also Splane the importance of elevation and consideration of compression which I doubt he would tolerate.  He will see again on an as-needed basis   [facility], MD [facility] Tel [phone] Pager [phone]  Note: Portions of  this report may have been transcribed using voice recognition software.  Every effort has been made to ensure accuracy; however, inadvertent computerized transcription errors may still be present.

## 2021-01-07 DIAGNOSIS — I509 Heart failure, unspecified: Secondary | ICD-10-CM | POA: Diagnosis not present

## 2021-01-07 DIAGNOSIS — E1165 Type 2 diabetes mellitus with hyperglycemia: Secondary | ICD-10-CM | POA: Diagnosis not present

## 2021-01-07 DIAGNOSIS — R600 Localized edema: Secondary | ICD-10-CM | POA: Diagnosis not present

## 2021-01-07 DIAGNOSIS — I4891 Unspecified atrial fibrillation: Secondary | ICD-10-CM | POA: Diagnosis not present

## 2021-01-07 DIAGNOSIS — Z794 Long term (current) use of insulin: Secondary | ICD-10-CM | POA: Diagnosis not present

## 2021-01-07 DIAGNOSIS — Z7984 Long term (current) use of oral hypoglycemic drugs: Secondary | ICD-10-CM | POA: Diagnosis not present

## 2021-01-07 DIAGNOSIS — J9611 Chronic respiratory failure with hypoxia: Secondary | ICD-10-CM | POA: Diagnosis not present

## 2021-01-07 DIAGNOSIS — I2510 Atherosclerotic heart disease of native coronary artery without angina pectoris: Secondary | ICD-10-CM | POA: Diagnosis not present

## 2021-01-07 DIAGNOSIS — I110 Hypertensive heart disease with heart failure: Secondary | ICD-10-CM | POA: Diagnosis not present

## 2021-01-07 DIAGNOSIS — J449 Chronic obstructive pulmonary disease, unspecified: Secondary | ICD-10-CM | POA: Diagnosis not present

## 2021-01-08 DIAGNOSIS — R600 Localized edema: Secondary | ICD-10-CM | POA: Diagnosis not present

## 2021-01-08 DIAGNOSIS — Z9181 History of falling: Secondary | ICD-10-CM | POA: Diagnosis not present

## 2021-01-08 DIAGNOSIS — S3210XS Unspecified fracture of sacrum, sequela: Secondary | ICD-10-CM | POA: Diagnosis not present

## 2021-01-10 DIAGNOSIS — I2510 Atherosclerotic heart disease of native coronary artery without angina pectoris: Secondary | ICD-10-CM | POA: Diagnosis not present

## 2021-01-10 DIAGNOSIS — Z794 Long term (current) use of insulin: Secondary | ICD-10-CM | POA: Diagnosis not present

## 2021-01-10 DIAGNOSIS — Z7984 Long term (current) use of oral hypoglycemic drugs: Secondary | ICD-10-CM | POA: Diagnosis not present

## 2021-01-10 DIAGNOSIS — I4891 Unspecified atrial fibrillation: Secondary | ICD-10-CM | POA: Diagnosis not present

## 2021-01-10 DIAGNOSIS — I110 Hypertensive heart disease with heart failure: Secondary | ICD-10-CM | POA: Diagnosis not present

## 2021-01-10 DIAGNOSIS — J449 Chronic obstructive pulmonary disease, unspecified: Secondary | ICD-10-CM | POA: Diagnosis not present

## 2021-01-10 DIAGNOSIS — I509 Heart failure, unspecified: Secondary | ICD-10-CM | POA: Diagnosis not present

## 2021-01-10 DIAGNOSIS — E1165 Type 2 diabetes mellitus with hyperglycemia: Secondary | ICD-10-CM | POA: Diagnosis not present

## 2021-01-10 DIAGNOSIS — J9611 Chronic respiratory failure with hypoxia: Secondary | ICD-10-CM | POA: Diagnosis not present

## 2021-01-10 DIAGNOSIS — R600 Localized edema: Secondary | ICD-10-CM | POA: Diagnosis not present

## 2021-01-14 DIAGNOSIS — I509 Heart failure, unspecified: Secondary | ICD-10-CM | POA: Diagnosis not present

## 2021-01-14 DIAGNOSIS — E1165 Type 2 diabetes mellitus with hyperglycemia: Secondary | ICD-10-CM | POA: Diagnosis not present

## 2021-01-14 DIAGNOSIS — J9611 Chronic respiratory failure with hypoxia: Secondary | ICD-10-CM | POA: Diagnosis not present

## 2021-01-14 DIAGNOSIS — J449 Chronic obstructive pulmonary disease, unspecified: Secondary | ICD-10-CM | POA: Diagnosis not present

## 2021-01-14 DIAGNOSIS — R600 Localized edema: Secondary | ICD-10-CM | POA: Diagnosis not present

## 2021-01-14 DIAGNOSIS — I110 Hypertensive heart disease with heart failure: Secondary | ICD-10-CM | POA: Diagnosis not present

## 2021-01-14 DIAGNOSIS — I4891 Unspecified atrial fibrillation: Secondary | ICD-10-CM | POA: Diagnosis not present

## 2021-01-14 DIAGNOSIS — I2510 Atherosclerotic heart disease of native coronary artery without angina pectoris: Secondary | ICD-10-CM | POA: Diagnosis not present

## 2021-01-14 DIAGNOSIS — Z794 Long term (current) use of insulin: Secondary | ICD-10-CM | POA: Diagnosis not present

## 2021-01-14 DIAGNOSIS — Z7984 Long term (current) use of oral hypoglycemic drugs: Secondary | ICD-10-CM | POA: Diagnosis not present

## 2021-01-15 DIAGNOSIS — M533 Sacrococcygeal disorders, not elsewhere classified: Secondary | ICD-10-CM | POA: Diagnosis not present

## 2021-01-15 DIAGNOSIS — M4317 Spondylolisthesis, lumbosacral region: Secondary | ICD-10-CM | POA: Diagnosis not present

## 2021-01-16 DIAGNOSIS — I4891 Unspecified atrial fibrillation: Secondary | ICD-10-CM | POA: Diagnosis not present

## 2021-01-16 DIAGNOSIS — E1165 Type 2 diabetes mellitus with hyperglycemia: Secondary | ICD-10-CM | POA: Diagnosis not present

## 2021-01-16 DIAGNOSIS — Z7984 Long term (current) use of oral hypoglycemic drugs: Secondary | ICD-10-CM | POA: Diagnosis not present

## 2021-01-16 DIAGNOSIS — R600 Localized edema: Secondary | ICD-10-CM | POA: Diagnosis not present

## 2021-01-16 DIAGNOSIS — I110 Hypertensive heart disease with heart failure: Secondary | ICD-10-CM | POA: Diagnosis not present

## 2021-01-16 DIAGNOSIS — J9611 Chronic respiratory failure with hypoxia: Secondary | ICD-10-CM | POA: Diagnosis not present

## 2021-01-16 DIAGNOSIS — I509 Heart failure, unspecified: Secondary | ICD-10-CM | POA: Diagnosis not present

## 2021-01-16 DIAGNOSIS — Z794 Long term (current) use of insulin: Secondary | ICD-10-CM | POA: Diagnosis not present

## 2021-01-16 DIAGNOSIS — J449 Chronic obstructive pulmonary disease, unspecified: Secondary | ICD-10-CM | POA: Diagnosis not present

## 2021-01-16 DIAGNOSIS — I2510 Atherosclerotic heart disease of native coronary artery without angina pectoris: Secondary | ICD-10-CM | POA: Diagnosis not present

## 2021-01-29 DIAGNOSIS — I509 Heart failure, unspecified: Secondary | ICD-10-CM | POA: Diagnosis not present

## 2021-01-29 DIAGNOSIS — R600 Localized edema: Secondary | ICD-10-CM | POA: Diagnosis not present

## 2021-01-29 DIAGNOSIS — I4891 Unspecified atrial fibrillation: Secondary | ICD-10-CM | POA: Diagnosis not present

## 2021-01-29 DIAGNOSIS — I2510 Atherosclerotic heart disease of native coronary artery without angina pectoris: Secondary | ICD-10-CM | POA: Diagnosis not present

## 2021-01-29 DIAGNOSIS — Z794 Long term (current) use of insulin: Secondary | ICD-10-CM | POA: Diagnosis not present

## 2021-01-29 DIAGNOSIS — I110 Hypertensive heart disease with heart failure: Secondary | ICD-10-CM | POA: Diagnosis not present

## 2021-01-29 DIAGNOSIS — Z7984 Long term (current) use of oral hypoglycemic drugs: Secondary | ICD-10-CM | POA: Diagnosis not present

## 2021-01-29 DIAGNOSIS — J9611 Chronic respiratory failure with hypoxia: Secondary | ICD-10-CM | POA: Diagnosis not present

## 2021-01-29 DIAGNOSIS — J449 Chronic obstructive pulmonary disease, unspecified: Secondary | ICD-10-CM | POA: Diagnosis not present

## 2021-01-29 DIAGNOSIS — E1165 Type 2 diabetes mellitus with hyperglycemia: Secondary | ICD-10-CM | POA: Diagnosis not present

## 2021-02-05 DIAGNOSIS — G629 Polyneuropathy, unspecified: Secondary | ICD-10-CM | POA: Diagnosis not present

## 2021-02-05 DIAGNOSIS — R600 Localized edema: Secondary | ICD-10-CM | POA: Diagnosis not present

## 2021-02-05 DIAGNOSIS — S3210XD Unspecified fracture of sacrum, subsequent encounter for fracture with routine healing: Secondary | ICD-10-CM | POA: Diagnosis not present

## 2021-02-05 DIAGNOSIS — M47817 Spondylosis without myelopathy or radiculopathy, lumbosacral region: Secondary | ICD-10-CM | POA: Diagnosis not present

## 2021-02-05 DIAGNOSIS — M533 Sacrococcygeal disorders, not elsewhere classified: Secondary | ICD-10-CM | POA: Diagnosis not present

## 2021-02-11 DIAGNOSIS — J449 Chronic obstructive pulmonary disease, unspecified: Secondary | ICD-10-CM | POA: Diagnosis not present

## 2021-02-11 DIAGNOSIS — I10 Essential (primary) hypertension: Secondary | ICD-10-CM | POA: Diagnosis not present

## 2021-02-11 DIAGNOSIS — R0989 Other specified symptoms and signs involving the circulatory and respiratory systems: Secondary | ICD-10-CM | POA: Diagnosis not present

## 2021-02-11 DIAGNOSIS — Z794 Long term (current) use of insulin: Secondary | ICD-10-CM | POA: Diagnosis not present

## 2021-02-11 DIAGNOSIS — E119 Type 2 diabetes mellitus without complications: Secondary | ICD-10-CM | POA: Diagnosis not present

## 2021-02-12 DIAGNOSIS — I509 Heart failure, unspecified: Secondary | ICD-10-CM | POA: Diagnosis not present

## 2021-02-12 DIAGNOSIS — M6281 Muscle weakness (generalized): Secondary | ICD-10-CM | POA: Diagnosis not present

## 2021-02-13 DIAGNOSIS — M6281 Muscle weakness (generalized): Secondary | ICD-10-CM | POA: Diagnosis not present

## 2021-02-13 DIAGNOSIS — I509 Heart failure, unspecified: Secondary | ICD-10-CM | POA: Diagnosis not present

## 2021-02-14 DIAGNOSIS — M6281 Muscle weakness (generalized): Secondary | ICD-10-CM | POA: Diagnosis not present

## 2021-02-14 DIAGNOSIS — I509 Heart failure, unspecified: Secondary | ICD-10-CM | POA: Diagnosis not present

## 2021-02-17 DIAGNOSIS — I4891 Unspecified atrial fibrillation: Secondary | ICD-10-CM | POA: Diagnosis not present

## 2021-02-17 DIAGNOSIS — E119 Type 2 diabetes mellitus without complications: Secondary | ICD-10-CM | POA: Diagnosis not present

## 2021-02-17 DIAGNOSIS — I509 Heart failure, unspecified: Secondary | ICD-10-CM | POA: Diagnosis not present

## 2021-02-17 DIAGNOSIS — J449 Chronic obstructive pulmonary disease, unspecified: Secondary | ICD-10-CM | POA: Diagnosis not present

## 2021-02-17 DIAGNOSIS — I10 Essential (primary) hypertension: Secondary | ICD-10-CM | POA: Diagnosis not present

## 2021-02-17 DIAGNOSIS — M6281 Muscle weakness (generalized): Secondary | ICD-10-CM | POA: Diagnosis not present

## 2021-02-18 DIAGNOSIS — M6281 Muscle weakness (generalized): Secondary | ICD-10-CM | POA: Diagnosis not present

## 2021-02-18 DIAGNOSIS — I509 Heart failure, unspecified: Secondary | ICD-10-CM | POA: Diagnosis not present

## 2021-02-19 DIAGNOSIS — M6281 Muscle weakness (generalized): Secondary | ICD-10-CM | POA: Diagnosis not present

## 2021-02-19 DIAGNOSIS — Z8673 Personal history of transient ischemic attack (TIA), and cerebral infarction without residual deficits: Secondary | ICD-10-CM | POA: Diagnosis not present

## 2021-02-19 DIAGNOSIS — I509 Heart failure, unspecified: Secondary | ICD-10-CM | POA: Diagnosis not present

## 2021-02-20 DIAGNOSIS — R0689 Other abnormalities of breathing: Secondary | ICD-10-CM | POA: Diagnosis not present

## 2021-02-20 DIAGNOSIS — M6281 Muscle weakness (generalized): Secondary | ICD-10-CM | POA: Diagnosis not present

## 2021-02-20 DIAGNOSIS — J9811 Atelectasis: Secondary | ICD-10-CM | POA: Diagnosis not present

## 2021-02-20 DIAGNOSIS — I509 Heart failure, unspecified: Secondary | ICD-10-CM | POA: Diagnosis not present

## 2021-02-21 DIAGNOSIS — M6281 Muscle weakness (generalized): Secondary | ICD-10-CM | POA: Diagnosis not present

## 2021-02-21 DIAGNOSIS — I509 Heart failure, unspecified: Secondary | ICD-10-CM | POA: Diagnosis not present

## 2021-02-24 DIAGNOSIS — M6281 Muscle weakness (generalized): Secondary | ICD-10-CM | POA: Diagnosis not present

## 2021-02-24 DIAGNOSIS — I509 Heart failure, unspecified: Secondary | ICD-10-CM | POA: Diagnosis not present

## 2021-02-25 ENCOUNTER — Ambulatory Visit: Payer: Medicare Other | Admitting: Urology

## 2021-02-25 DIAGNOSIS — I509 Heart failure, unspecified: Secondary | ICD-10-CM | POA: Diagnosis not present

## 2021-02-25 DIAGNOSIS — M6281 Muscle weakness (generalized): Secondary | ICD-10-CM | POA: Diagnosis not present

## 2021-02-25 DIAGNOSIS — E119 Type 2 diabetes mellitus without complications: Secondary | ICD-10-CM | POA: Diagnosis not present

## 2021-02-25 DIAGNOSIS — R7309 Other abnormal glucose: Secondary | ICD-10-CM | POA: Diagnosis not present

## 2021-02-25 DIAGNOSIS — I10 Essential (primary) hypertension: Secondary | ICD-10-CM | POA: Diagnosis not present

## 2021-02-26 DIAGNOSIS — M6281 Muscle weakness (generalized): Secondary | ICD-10-CM | POA: Diagnosis not present

## 2021-02-26 DIAGNOSIS — I509 Heart failure, unspecified: Secondary | ICD-10-CM | POA: Diagnosis not present

## 2021-02-27 DIAGNOSIS — I509 Heart failure, unspecified: Secondary | ICD-10-CM | POA: Diagnosis not present

## 2021-02-27 DIAGNOSIS — M6281 Muscle weakness (generalized): Secondary | ICD-10-CM | POA: Diagnosis not present

## 2021-02-28 DIAGNOSIS — M6281 Muscle weakness (generalized): Secondary | ICD-10-CM | POA: Diagnosis not present

## 2021-02-28 DIAGNOSIS — I509 Heart failure, unspecified: Secondary | ICD-10-CM | POA: Diagnosis not present

## 2021-03-03 ENCOUNTER — Ambulatory Visit (INDEPENDENT_AMBULATORY_CARE_PROVIDER_SITE_OTHER): Payer: Medicare Other | Admitting: Nurse Practitioner

## 2021-03-03 ENCOUNTER — Encounter: Payer: Self-pay | Admitting: Nurse Practitioner

## 2021-03-03 ENCOUNTER — Other Ambulatory Visit: Payer: Self-pay

## 2021-03-03 VITALS — BP 139/79 | HR 89 | Ht 69.0 in

## 2021-03-03 DIAGNOSIS — E039 Hypothyroidism, unspecified: Secondary | ICD-10-CM | POA: Diagnosis not present

## 2021-03-03 DIAGNOSIS — E119 Type 2 diabetes mellitus without complications: Secondary | ICD-10-CM | POA: Diagnosis not present

## 2021-03-03 DIAGNOSIS — I509 Heart failure, unspecified: Secondary | ICD-10-CM | POA: Diagnosis not present

## 2021-03-03 DIAGNOSIS — Z794 Long term (current) use of insulin: Secondary | ICD-10-CM | POA: Diagnosis not present

## 2021-03-03 DIAGNOSIS — M6281 Muscle weakness (generalized): Secondary | ICD-10-CM | POA: Diagnosis not present

## 2021-03-03 LAB — POCT GLYCOSYLATED HEMOGLOBIN (HGB A1C): Hemoglobin A1C: 8.7 % — AB (ref 4.0–5.6)

## 2021-03-03 NOTE — Patient Instructions (Signed)

## 2021-03-03 NOTE — Progress Notes (Signed)
Endocrinology Follow Up Note       03/03/2021, 11:32 AM   Subjective:    Patient ID: John Kirk, male    DOB: 12/26/1940.  John Kirk is being seen in follow up after being seen in consultation for management of currently uncontrolled symptomatic diabetes requested by  Beatrix FettersKotturi, Vinay K, MD.   Past Medical History:  Diagnosis Date  . 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 Atrial fibrillation (HCC)   . CHF (congestive heart failure) (HCC)   . COPD (chronic obstructive pulmonary disease) (HCC)   . CVA (cerebral vascular accident) (HCC)   . DM2 (diabetes mellitus, type 2) (HCC)   . Hypertension   . Hypothyroidism   . Pneumonia     Past Surgical History:  Procedure Laterality Date  . CHOLECYSTECTOMY    . LUMBAR LAMINECTOMY     x 2  . NEPHROLITHOTOMY Right     Social History   Socioeconomic History  . Marital status: Widowed    Spouse name: Not on file  . Number of children: 1  . Years of education: Not on file  . Highest education level: Not on file  Occupational History  . Not on file  Tobacco Use  . Smoking status: Former Smoker    Years: 20.00  . Smokeless tobacco: Former NeurosurgeonUser    Types: Chew  . Tobacco comment: smoked from [age]yr to 26 yr  Vaping Use  . Vaping Use: Never used  Substance and Sexual Activity  . Alcohol use: Never  . Drug use: Never  . Sexual activity: Not on file  Other Topics Concern  . Not on file  Social History Narrative  . Not on file   Social Determinants of Health   Financial Resource Strain: Not on file  Food Insecurity: Not on file  Transportation Needs: Not on file  Physical Activity: Not on file  Stress: Not on file  Social Connections: Not on file    Family History  Problem Relation Age of Onset  . Diabetes Mother   . Thyroid disease Mother   . Hypertension Mother   . Hyperlipidemia Mother   . Heart attack Mother    . Cancer Mother   . Hypertension Father   . Cancer Father     Outpatient Encounter Medications as of 03/03/2021  Medication Sig  . acetaminophen (TYLENOL) 500 MG tablet Take 1,000 mg by mouth in the morning.  Marland Kitchen. albuterol (VENTOLIN HFA) 108 (90 Base) MCG/ACT inhaler Inhale 2 puffs into the lungs in the morning, at noon, and at bedtime. *May use every 4 hours as needed for wheezing  . allopurinol (ZYLOPRIM) 100 MG tablet Take 100 mg by mouth daily.  Marland Kitchen. ALPRAZolam (XANAX) 0.25 MG tablet Take 0.25 mg by mouth 2 (two) times daily as needed for anxiety.  Marland Kitchen. apixaban (ELIQUIS) 5 MG TABS tablet Take 5 mg by mouth 2 (two) times daily.  . ARIPiprazole (ABILIFY) 2 MG tablet Take 2 mg by mouth in the morning.  . BD AUTOSHIELD DUO 30G X 5 MM MISC   . bisacodyl (DULCOLAX) 10 MG suppository Place 10 mg rectally daily as needed for moderate constipation.  .Marland Kitchen  Cholecalciferol (VITAMIN D3) 1.25 MG (50000 UT) CAPS Take 1 capsule by mouth once a week.  . divalproex (DEPAKOTE) 125 MG DR tablet Take 125 mg by mouth every 12 (twelve) hours.  . DULoxetine (CYMBALTA) 60 MG capsule Take 60 mg by mouth daily.  . fenofibrate (TRICOR) 145 MG tablet Take 145 mg by mouth daily.  . furosemide (LASIX) 20 MG tablet Take 40 mg by mouth in the morning.   . gabapentin (NEURONTIN) 100 MG capsule Take 100 mg by mouth in the morning and at bedtime.  . hydrocortisone 2.5 % cream Apply 1 application topically daily at 6 (six) AM. Sparingly  to face/neck every evening  . insulin aspart (NOVOLOG FLEXPEN) 100 UNIT/ML FlexPen Inject 12-18 Units into the skin 3 (three) times daily before meals. 90-150= 12u, 151-200= 13u, 201-250= 14u, 251-300= 15u, 301-350= 16u, 351-400= 17u, >400= 18u. Notify provider if less than 70 or greater than 400  . insulin detemir (LEVEMIR FLEXTOUCH) 100 UNIT/ML FlexPen Inject 70 Units into the skin at bedtime. Prime pen with 2u  prior to each use. Once opened leave at room temp  . isosorbide mononitrate (IMDUR) 30  MG 24 hr tablet Take 30 mg by mouth in the morning.  Marland Kitchen ketoconazole (NIZORAL) 2 % shampoo Apply 1 application topically 3 (three) times a week. Evern Bio, and Thurs  . levETIRAcetam (KEPPRA) 500 MG tablet Take 500 mg by mouth 2 (two) times daily.  Marland Kitchen levothyroxine (SYNTHROID) 125 MCG tablet Take 125 mcg by mouth daily.  Marland Kitchen lisinopril (ZESTRIL) 10 MG tablet Take 10 mg by mouth daily.  Marland Kitchen MAGNESIUM OXIDE PO Take 500 mg by mouth 2 (two) times daily.   . metFORMIN (GLUCOPHAGE-XR) 500 MG 24 hr tablet Take 500 mg by mouth every morning.  . mirabegron ER (MYRBETRIQ) 50 MG TB24 tablet Take 1 tablet (50 mg total) by mouth daily.  . nitroGLYCERIN (NITROSTAT) 0.4 MG SL tablet Place 0.4 mg under the tongue every 5 (five) minutes as needed for chest pain.  Marland Kitchen Olopatadine HCl 0.2 % SOLN Place 2 drops into both eyes daily.  Marland Kitchen oxyCODONE-acetaminophen (PERCOCET) 5-325 MG tablet Take 1 tablet by mouth every 6 (six) hours as needed for severe pain.  . OXYGEN Inhale 2 L into the lungs continuous. At Bedtime  . polyethylene glycol (MIRALAX / GLYCOLAX) 17 g packet Take 17 g by mouth daily. *Mixed in 4-8 ounces of water-drink  . Propylene Glycol (SYSTANE COMPLETE) 0.6 % SOLN Place 1 drop into both eyes in the morning, at noon, in the evening, and at bedtime.  . rosuvastatin (CRESTOR) 10 MG tablet Take 10 mg by mouth in the morning.  . senna-docusate (SENOKOT-S) 8.6-50 MG tablet Take 2 tablets by mouth 2 (two) times daily.  . simethicone (MYLICON) 80 MG chewable tablet Chew 80 mg by mouth in the morning.  . tamsulosin (FLOMAX) 0.4 MG CAPS capsule Take 0.4 mg by mouth in the morning. *Give after the same meal   No facility-administered encounter medications on file as of 03/03/2021.    ALLERGIES: Allergies  Allergen Reactions  . Celery Oil     Unknown-listed on MAR provided by facility  . Morphine And Related     Unknown-listed on MAR provided by facility  . Sulfa Antibiotics     VACCINATION STATUS:  There  is no immunization history on file for this patient.  Diabetes He presents for his follow-up diabetic visit. He has type 2 diabetes mellitus. Onset time: He was diagnosed at approximate  age of 44 years. His disease course has been improving. There are no hypoglycemic associated symptoms. Pertinent negatives for hypoglycemia include no confusion, headaches, nervousness/anxiousness, pallor, seizures or tremors. Associated symptoms include blurred vision, fatigue, foot paresthesias, polydipsia and polyuria. Pertinent negatives for diabetes include no chest pain, no polyphagia, no weakness and no weight loss. There are no hypoglycemic complications. Symptoms are stable. Diabetic complications include a CVA, heart disease, nephropathy and peripheral neuropathy. Risk factors for coronary artery disease include male sex, obesity, sedentary lifestyle, tobacco exposure, family history, diabetes mellitus, hypertension and dyslipidemia. Current diabetic treatment includes intensive insulin program and oral agent (monotherapy). He is compliant with treatment all of the time (with assistance from assisted living facility staff). He is following a generally unhealthy diet. When asked about meal planning, he reported none. He has not had a previous visit with a dietitian. He never participates in exercise. His home blood glucose trend is fluctuating minimally. His breakfast blood glucose range is generally >200 mg/dl. His lunch blood glucose range is generally 180-200 mg/dl. His dinner blood glucose range is generally >200 mg/dl. His bedtime blood glucose range is generally >200 mg/dl. His overall blood glucose range is >200 mg/dl. (He presents today, accompanied by his care attendant from the nursing home, with logs showing persistent hyperglycemia, both fasting and postprandially.  His POCT A1c today is 8.7%, improving some from previous visit of 9.5%.  There are no episodes of hypoglycemia noted.  He was especially fatigued  today, says he did not sleep well last night.) An ACE inhibitor/angiotensin II receptor blocker is being taken. He sees a podiatrist (Podiatrist comes to ALF periodically).Eye exam is current.  Thyroid Problem Presents for follow-up visit. Onset time: Patient does not recall the circumstance of his diagnosis with hypothyroidism. Symptoms include fatigue. Patient reports no anxiety, cold intolerance, constipation, depressed mood, diarrhea, heat intolerance, palpitations, tremors, weight gain or weight loss. The symptoms have been stable. Past treatments include levothyroxine (Is currently on levothyroxine 125 mg p.o. daily.). The following procedures have not been performed: radioiodine uptake scan and thyroidectomy. His past medical history is significant for diabetes, heart failure and obesity.  Hypertension This is a chronic problem. The current episode started more than 1 year ago. The problem is unchanged. The problem is controlled. Associated symptoms include blurred vision. Pertinent negatives include no chest pain, headaches, neck pain, palpitations or shortness of breath. Agents associated with hypertension include thyroid hormones. Risk factors for coronary artery disease include diabetes mellitus, male gender, obesity, sedentary lifestyle, smoking/tobacco exposure, family history and dyslipidemia. Past treatments include ACE inhibitors and diuretics. The current treatment provides mild improvement. There are no compliance problems.  Hypertensive end-organ damage includes kidney disease, CAD/MI, CVA and heart failure. Identifiable causes of hypertension include chronic renal disease and a thyroid problem.    Review of systems  Constitutional: + Minimally fluctuating body weight,  current Body mass index is 36.48 kg/m. , + fatigue, no subjective hyperthermia, no subjective hypothermia Eyes: + blurry vision- improving, no xerophthalmia ENT: no sore throat, no nodules palpated in throat, no  dysphagia/odynophagia, no hoarseness Cardiovascular: no chest pain, no shortness of breath, no palpitations, + leg swelling Respiratory: no cough, no shortness of breath Gastrointestinal: no nausea/vomiting/diarrhea Musculoskeletal: no muscle/joint aches, wheelchair bound due to previous CVA, disequilibrium, deconditioning Skin: no rashes, no hyperemia Neurological: no tremors, no numbness, no tingling, no dizziness Psychiatric: no depression, no anxiety    Objective:    BP 139/79   Pulse 89   Ht   (1.753 m)   BMI 36.48 kg/m   Wt Readings from Last 3 Encounters:  01/06/21 247 lb (112 kg)  12/31/20 249 lb (112.9 kg)  12/04/20 234 lb (106.1 kg)    BP Readings from Last 3 Encounters:  03/03/21 139/79  01/06/21 110/71  12/31/20 (!) 110/56    Physical Exam- Limited  Constitutional:  Body mass index is 36.48 kg/m. , not in acute distress, flat affect Eyes:  EOMI, no exophthalmos Neck: Supple Cardiovascular: RRR, no murmurs, rubs, or gallops, + BLE pitting edema  Respiratory: Adequate breathing efforts, no crackles, rales, rhonchi, or wheezing Musculoskeletal: no gross deformities, wheelchair bound due to previous CVA Skin:  no rashes, no hyperemia Neurological: no tremor with outstretched hands     CMP ( most recent) CMP     Component Value Date/Time   NA 141 12/31/2020 0846   NA 142 11/13/2020 0000   K 3.7 12/31/2020 0846   CL 100 12/31/2020 0846   CO2 32 12/31/2020 0846   GLUCOSE 115 (H) 12/31/2020 0846   BUN 19 12/31/2020 0846   BUN 19 11/13/2020 0000   CREATININE 0.97 12/31/2020 0846   CALCIUM 8.9 12/31/2020 0846   PROT 6.9 12/31/2020 0846   ALBUMIN 3.8 12/31/2020 0846   AST 17 12/31/2020 0846   ALT 13 12/31/2020 0846   ALKPHOS 28 (L) 12/31/2020 0846   BILITOT 0.6 12/31/2020 0846   GFRNONAA >60 12/31/2020 0846   GFRAA 77 11/13/2020 0000     Diabetic Labs (most recent): Lab Results  Component Value Date   HGBA1C 8.7 (A) 03/03/2021   HGBA1C 9.5  (A) 12/04/2020   HGBA1C 8.8 (A) 08/01/2020     Lab Results  Component Value Date   TSH 2.74 11/13/2020   TSH 1.70 03/20/2020      Assessment & Plan:   1) Type 2 diabetes mellitus without complication, with long-term current use   - John Kirk has currently uncontrolled symptomatic type 2 DM since 80 years of age.   Recent labs reviewed.  He presents today, accompanied by his care attendant from the nursing home, with logs showing persistent hyperglycemia, both fasting and postprandially.  His POCT A1c today is 8.7%, improving some from previous visit of 9.5%.  There are no episodes of hypoglycemia noted.  He was especially fatigued today, says he did not sleep well last night.  - I had a long discussion with him about the progressive nature of diabetes and the pathology behind its complications. -his diabetes is complicated by CVA, obesity/sedentary life and he remains at a high risk for more acute and chronic complications which include CAD, CVA, CKD, retinopathy, and neuropathy. These are all discussed in detail with him.  - Nutritional counseling repeated at each appointment due to patients tendency to fall back in to old habits.  - The patient admits there is a room for improvement in their diet and drink choices. -  Suggestion is made for the patient to avoid simple carbohydrates from their diet including Cakes, Sweet Desserts / Pastries, Ice Cream, Soda (diet and regular), Sweet Tea, Candies, Chips, Cookies, Sweet Pastries, Store Bought Juices, Alcohol in Excess of 1-2 drinks a day, Artificial Sweeteners, Coffee Creamer, and "Sugar-free" Products. This will help patient to have stable blood glucose profile and potentially avoid unintended weight gain.   - I encouraged the patient to switch to unprocessed or minimally processed complex starch and increased protein intake (animal or plant source), fruits, and vegetables.   -  Patient is advised to stick to a routine mealtimes  to eat 3 meals a day and avoid unnecessary snacks (to snack only to correct hypoglycemia).  - I have approached him with the following individualized plan to manage  his diabetes and patient agrees:   -In light of his chronic glycemic burden, he will need intensive treatment with basal/bolus insulin in order for him to achieve and maintain control of diabetes to target.  -He will tolerate slight increase in his Levemir to 70 units SQ daily at bedtime, and continue his Novolog at 12-18 units TID with meals if glucose is above 90 and he is eating.  Specific instructions on how to titrate insulin dose based on glucose readings given to patient/aide in writing.  He is also advised to continue Metformin 500 mg ER daily with breakfast.   -He is encouraged to continue monitoring glucose 4 times daily, before meals and before bed, and call the clinic if he has readings less than 70 or greater than 300 for 3 tests in a row.  - Specific targets for  A1c;  LDL, HDL,  and Triglycerides were discussed with the patient.  2) Blood Pressure /Hypertension:   his blood pressure is controlled to target.   he is advised to continue his current medications including Lisinopril 10 mg p.o. daily with breakfast and Lasix 40 mg po daily.  3) Lipids/Hyperlipidemia:  His recent lipid panel from 11/04/20 shows controlled LDL of 75 and slightly elevated triglycerides of 156.  He is not currently on any lipid lowering medications (I suspect may be as a result of advanced age and need to simplify medication regimen due to risk of polypharmacy).   4)  Weight/Diet:  His Body mass index is 36.48 kg/m.  -   clearly complicating his diabetes care.   he is  a candidate for weight loss. I discussed with him the fact that loss of 5 - 10% of his  current body weight will have the most impact on his diabetes management.  Exercise, and detailed carbohydrates information provided  -  detailed on discharge instructions.  5)  Hypothyroidism: -There are no recent TFTs to review.  He is advised to continue Levothyroxine 125 mcg po daily before breakfast.  Will recheck TFTs prior to next visit and adjust dose if necessary.    - We discussed about the correct intake of his thyroid hormone, on empty stomach at fasting, with water, separated by at least 30 minutes from breakfast and other medications,  and separated by more than 4 hours from calcium, iron, multivitamins, acid reflux medications (PPIs). -Patient is made aware of the fact that thyroid hormone replacement is needed for life, dose to be adjusted by periodic monitoring of thyroid function tests.  6) Chronic Care/Health Maintenance: -he is on ACEI medications and is encouraged to initiate and continue to follow up with Ophthalmology, Dentist, Podiatrist at least yearly or according to recommendations, and advised to stay away from smoking. I have recommended yearly flu vaccine and pneumonia vaccine at least every 5 years; moderate intensity exercise for up to 150 minutes weekly; and  sleep for at least 7 hours a day.  - he is advised to maintain close follow up with Kotturi, Zadie Rhine, MD for primary care needs, as well as his other providers for optimal and coordinated care.     I spent 40 minutes in the care of the patient today including review of labs from CMP, Lipids, Thyroid Function, Hematology (current and  previous including abstractions from other facilities); face-to-face time discussing  his blood glucose readings/logs, discussing hypoglycemia and hyperglycemia episodes and symptoms, medications doses, his options of short and long term treatment based on the latest standards of care / guidelines;  discussion about incorporating lifestyle medicine;  and documenting the encounter.    Please refer to Patient Instructions for Blood Glucose Monitoring and Insulin/Medications Dosing Guide"  in media tab for additional information. Please  also refer to " Patient  Self Inventory" in the Media  tab for reviewed elements of pertinent patient history.  John Dyer participated in the discussions, expressed understanding, and voiced agreement with the above plans.  All questions were answered to his satisfaction. he is encouraged to contact clinic should he have any questions or concerns prior to his return visit.  Follow up plan: - Return in about 3 months (around 06/03/2021) for Diabetes F/U with A1c in office, Bring meter and logs, Previsit labs.  Ronny Bacon, [facility] [address] [place], [place] [address] Phone: [phone] Fax: [phone]  03/03/2021, 11:32 AM

## 2021-03-04 DIAGNOSIS — I509 Heart failure, unspecified: Secondary | ICD-10-CM | POA: Diagnosis not present

## 2021-03-04 DIAGNOSIS — M6281 Muscle weakness (generalized): Secondary | ICD-10-CM | POA: Diagnosis not present

## 2021-03-05 DIAGNOSIS — M6281 Muscle weakness (generalized): Secondary | ICD-10-CM | POA: Diagnosis not present

## 2021-03-05 DIAGNOSIS — I509 Heart failure, unspecified: Secondary | ICD-10-CM | POA: Diagnosis not present

## 2021-03-06 DIAGNOSIS — I509 Heart failure, unspecified: Secondary | ICD-10-CM | POA: Diagnosis not present

## 2021-03-06 DIAGNOSIS — M6281 Muscle weakness (generalized): Secondary | ICD-10-CM | POA: Diagnosis not present

## 2021-03-07 DIAGNOSIS — M6281 Muscle weakness (generalized): Secondary | ICD-10-CM | POA: Diagnosis not present

## 2021-03-07 DIAGNOSIS — I509 Heart failure, unspecified: Secondary | ICD-10-CM | POA: Diagnosis not present

## 2021-03-10 DIAGNOSIS — M6281 Muscle weakness (generalized): Secondary | ICD-10-CM | POA: Diagnosis not present

## 2021-03-10 DIAGNOSIS — I509 Heart failure, unspecified: Secondary | ICD-10-CM | POA: Diagnosis not present

## 2021-03-11 DIAGNOSIS — I509 Heart failure, unspecified: Secondary | ICD-10-CM | POA: Diagnosis not present

## 2021-03-11 DIAGNOSIS — M6281 Muscle weakness (generalized): Secondary | ICD-10-CM | POA: Diagnosis not present

## 2021-03-13 ENCOUNTER — Ambulatory Visit: Payer: Medicare Other | Admitting: Urology

## 2021-03-13 DIAGNOSIS — R32 Unspecified urinary incontinence: Secondary | ICD-10-CM

## 2021-04-02 DIAGNOSIS — E119 Type 2 diabetes mellitus without complications: Secondary | ICD-10-CM | POA: Diagnosis not present

## 2021-04-02 DIAGNOSIS — I4891 Unspecified atrial fibrillation: Secondary | ICD-10-CM | POA: Diagnosis not present

## 2021-04-17 DIAGNOSIS — Z79899 Other long term (current) drug therapy: Secondary | ICD-10-CM | POA: Diagnosis not present

## 2021-05-07 DIAGNOSIS — E119 Type 2 diabetes mellitus without complications: Secondary | ICD-10-CM | POA: Diagnosis not present

## 2021-05-07 DIAGNOSIS — I4891 Unspecified atrial fibrillation: Secondary | ICD-10-CM | POA: Diagnosis not present

## 2021-05-07 DIAGNOSIS — J449 Chronic obstructive pulmonary disease, unspecified: Secondary | ICD-10-CM | POA: Diagnosis not present

## 2021-05-07 DIAGNOSIS — I10 Essential (primary) hypertension: Secondary | ICD-10-CM | POA: Diagnosis not present

## 2021-05-08 ENCOUNTER — Other Ambulatory Visit: Payer: Self-pay

## 2021-05-08 ENCOUNTER — Encounter: Payer: Self-pay | Admitting: Urology

## 2021-05-08 ENCOUNTER — Ambulatory Visit (INDEPENDENT_AMBULATORY_CARE_PROVIDER_SITE_OTHER): Payer: Medicare Other | Admitting: Urology

## 2021-05-08 VITALS — BP 169/77 | HR 66

## 2021-05-08 DIAGNOSIS — R32 Unspecified urinary incontinence: Secondary | ICD-10-CM | POA: Diagnosis not present

## 2021-05-08 DIAGNOSIS — I509 Heart failure, unspecified: Secondary | ICD-10-CM | POA: Diagnosis not present

## 2021-05-08 DIAGNOSIS — R339 Retention of urine, unspecified: Secondary | ICD-10-CM

## 2021-05-08 DIAGNOSIS — N401 Enlarged prostate with lower urinary tract symptoms: Secondary | ICD-10-CM

## 2021-05-08 DIAGNOSIS — M6281 Muscle weakness (generalized): Secondary | ICD-10-CM | POA: Diagnosis not present

## 2021-05-08 DIAGNOSIS — N138 Other obstructive and reflux uropathy: Secondary | ICD-10-CM | POA: Diagnosis not present

## 2021-05-08 LAB — BLADDER SCAN AMB NON-IMAGING

## 2021-05-08 MED ORDER — FINASTERIDE 5 MG PO TABS: 5.0000 mg | ORAL_TABLET | Freq: Every day | ORAL | 3 refills | Status: AC

## 2021-05-08 NOTE — Progress Notes (Signed)
Subjective: 1. Urinary incontinence, unspecified type   2. BPH with urinary obstruction   3. Incomplete bladder emptying      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 He remains on Myrbetriq and tamsulosin but continues to leak.   His PVR today is which is reasonably stable.  He is wearing pull-ups which he changed 2-3x daily.  He will go and void but has urgency and he will leak with walking.  He has frequency more often that q1hr.  He has nocturnal incontinence that can soak the bed through the diaper.  He has some dysuria.   He has a history of stones and a left nephrolithotomy. He has a history of BPH with BOO  and has had no prostate surgery.      IPSS     Row Name 05/08/21 1300         International Prostate Symptom Score   How often have you had the sensation of not emptying your bladder? Less than half the time     How often have you had to urinate less than every two hours? More than half the time     How often have you found you stopped and started again several times when you urinated? More than half the time     How often have you found it difficult to postpone urination? More than half the time     How often have you had a weak urinary stream? Almost always     How often have you had to strain to start urination? Less than half the time     How many times did you typically get up at night to urinate? 3 Times     Total IPSS Score 24           Quality of Life due to urinary symptoms     If you were to spend the rest of your life with your urinary condition just the way it is now how would you feel about that? 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) (HCC)    Atrial fibrillation (HCC)    CHF (congestive heart failure) (HCC)    COPD (chronic obstructive pulmonary disease) (HCC)    CVA (cerebral vascular accident) (HCC)    DM2 (diabetes mellitus, type 2) (HCC)    Hypertension    Hypothyroidism    Pneumonia     Past Surgical History:  Procedure Laterality Date   CHOLECYSTECTOMY     LUMBAR LAMINECTOMY     x 2   NEPHROLITHOTOMY Right     Social History   Socioeconomic History   Marital status: Widowed    Spouse name: Not on file   Number of children: 1   Years of education: Not on file   Highest education level: Not on file  Occupational History   Not on file  Tobacco Use   Smoking status: Former    Years: 20.00    Types: Cigarettes   Smokeless tobacco: Former    Types: Chew   Tobacco  comments:    smoked from 4yr to 26 yr  Vaping Use   Vaping Use: Never used  Substance and Sexual Activity   Alcohol use: Never   Drug use: Never   Sexual activity: Not on file  Other Topics Concern   Not on file  Social History Narrative   Not on file   Social Determinants of Health   Financial Resource Strain: Not on file  Food Insecurity: Not on file  Transportation Needs: Not on file  Physical Activity: Not on file  Stress: Not on file  Social Connections: Not on file  Intimate Partner Violence: Not on file    Family History  Problem Relation Age of Onset   Diabetes Mother    Thyroid disease Mother    Hypertension Mother    Hyperlipidemia Mother    Heart attack Mother    Cancer Mother    Hypertension Father    Cancer Father     Anti-infectives: Anti-infectives (From admission, onward)    None       Current Outpatient Medications  Medication Sig Dispense Refill   finasteride (PROSCAR) 5 MG tablet Take 1 tablet (5 mg total) by mouth daily. 90 tablet 3   acetaminophen (TYLENOL) 500 MG tablet Take 1,000 mg by mouth in the morning.     albuterol (VENTOLIN HFA) 108 (90  Base) MCG/ACT inhaler Inhale 2 puffs into the lungs in the morning, at noon, and at bedtime. *May use every 4 hours as needed for wheezing     allopurinol (ZYLOPRIM) 100 MG tablet Take 100 mg by mouth daily.     ALPRAZolam (XANAX) 0.25 MG tablet Take 0.25 mg by mouth 2 (two) times daily as needed for anxiety.     apixaban (ELIQUIS) 5 MG TABS tablet Take 5 mg by mouth 2 (two) times daily.     ARIPiprazole (ABILIFY) 2 MG tablet Take 2 mg by mouth in the morning.     BD AUTOSHIELD DUO 30G X 5 MM MISC      bisacodyl (DULCOLAX) 10 MG suppository Place 10 mg rectally daily as needed for moderate constipation.     Cholecalciferol (VITAMIN D3) 1.25 MG (50000 UT) CAPS Take 1 capsule by mouth once a week.     divalproex (DEPAKOTE) 125 MG DR tablet Take 125 mg by mouth every 12 (twelve) hours.     DULoxetine (CYMBALTA) 60 MG capsule Take 60 mg by mouth daily.     fenofibrate (TRICOR) 145 MG tablet Take 145 mg by mouth daily.     furosemide (LASIX) 20 MG tablet Take 40 mg by mouth in the morning.      gabapentin (NEURONTIN) 100 MG capsule Take 100 mg by mouth in the morning and at bedtime.     hydrocortisone 2.5 % cream Apply 1 application topically daily at 6 (six) AM. Sparingly  to face/neck every evening     insulin aspart (NOVOLOG FLEXPEN) 100 UNIT/ML FlexPen Inject 12-18 Units into the skin 3 (three) times daily before meals. 90-150= 12u, 151-200= 13u, 201-250= 14u, 251-300= 15u, 301-350= 16u, 351-400= 17u, >400= 18u. Notify provider if less than 70 or greater than 400     insulin detemir (LEVEMIR FLEXTOUCH) 100 UNIT/ML FlexPen Inject 70 Units into the skin at bedtime. Prime pen with 2u  prior to each use. Once opened leave at room temp     isosorbide mononitrate (IMDUR) 30 MG 24 hr tablet Take 30 mg by mouth in the morning.     ketoconazole (NIZORAL)  2 % shampoo Apply 1 application topically 3 (three) times a week. Ever[employer], and Thurs     levETIRAcetam (KEPPRA) 500 MG tablet Take 500 mg by mouth 2 (two)  times daily.     levothyroxine (SYNTHROID) 125 MCG tablet Take 125 mcg by mouth daily.     lisinopril (ZESTRIL) 10 MG tablet Take 10 mg by mouth daily.     MAGNESIUM OXIDE PO Take 500 mg by mouth 2 (two) times daily.      metFORMIN (GLUCOPHAGE-XR) 500 MG 24 hr tablet Take 500 mg by mouth every morning.     mirabegron ER (MYRBETRIQ) 50 MG TB24 tablet Take 1 tablet (50 mg total) by mouth daily. 28 tablet 0   nitroGLYCERIN (NITROSTAT) 0.4 MG SL tablet Place 0.4 mg under the tongue every 5 (five) minutes as needed for chest pain.     Olopatadine HCl 0.2 % SOLN Place 2 drops into both eyes daily.     oxyCODONE-acetaminophen (PERCOCET) 5-325 MG tablet Take 1 tablet by mouth every 6 (six) hours as needed for severe pain. 10 tablet 0   OXYGEN Inhale 2 L into the lungs continuous. At Bedtime     polyethylene glycol (MIRALAX / GLYCOLAX) 17 g packet Take 17 g by mouth daily. *Mixed in 4-8 ounces of water-drink     Propylene Glycol (SYSTANE COMPLETE) 0.6 % SOLN Place 1 drop into both eyes in the morning, at noon, in the evening, and at bedtime.     rosuvastatin (CRESTOR) 10 MG tablet Take 10 mg by mouth in the morning.     senna-docusate (SENOKOT-S) 8.6-50 MG tablet Take 2 tablets by mouth 2 (two) times daily.     simethicone (MYLICON) 80 MG chewable tablet Chew 80 mg by mouth in the morning.     tamsulosin (FLOMAX) 0.4 MG CAPS capsule Take 0.4 mg by mouth in the morning. *Give after the same meal     No current facility-administered medications for this visit.     Objective: Vital signs in last 24 hours: BP (!) 169/77   Pulse 66   Intake/Output from previous day: No intake/output data recorded. Intake/Output this shift: @IOTHISSHIFT @   Physical Exam  Lab Results:  No results found for this or any previous visit (from the past 24 hour(s)).   BMET No results for input(s): NA, K, CL, CO2, GLUCOSE, BUN, CREATININE, CALCIUM in the last 72 hours. PT/INR No results for input(s): LABPROT,  INR in the last 72 hours. ABG No results for input(s): PHART, HCO3 in the last 72 hours.  Invalid input(s): PCO2, PO2  Studies/Results: BMP and CBC unremarkable in 3/22 and UA clear.   Cr 0.97.  Glu 115.   Assessment/Plan: OAB wet:  His PVR is up some at 4/22 from 19ml in 9/21.  I will have him continue the Myrbetriq and tamsulosin for now and I have added finasteride   Side effects and drug interactions reviewed.   Meds ordered this encounter  Medications   finasteride (PROSCAR) 5 MG tablet    Sig: Take 1 tablet (5 mg total) by mouth daily.    Dispense:  90 tablet    Refill:  3     Orders Placed This Encounter  Procedures   Urinalysis, Routine w reflex microscopic   BLADDER SCAN AMB NON-IMAGING     Return in about 3 months (around 08/08/2021) for with PVR.    CC: 08/10/2021 MD    Bertram Savin 05/08/2021 [phone]

## 2021-05-08 NOTE — Progress Notes (Signed)
scUrological Symptom Review PVR:252ml Patient is experiencing the following symptoms: N/a   Review of Systems  Gastrointestinal (upper)  : Negative for upper GI symptoms  Gastrointestinal (lower) : Negative for lower GI symptoms  Constitutional : Negative for symptoms  Skin: Negative for skin symptoms  Eyes: Negative for eye symptoms  Ear/Nose/Throat : Negative for Ear/Nose/Throat symptoms  Hematologic/Lymphatic: Negative for Hematologic/Lymphatic symptoms  Cardiovascular : Negative for cardiovascular symptoms  Respiratory : Negative for respiratory symptoms  Endocrine: Negative for endocrine symptoms  Musculoskeletal: Negative for musculoskeletal symptoms  Neurological: Negative for neurological symptoms  Psychologic: Negative for psychiatric symptoms

## 2021-05-09 DIAGNOSIS — M6281 Muscle weakness (generalized): Secondary | ICD-10-CM | POA: Diagnosis not present

## 2021-05-09 DIAGNOSIS — I509 Heart failure, unspecified: Secondary | ICD-10-CM | POA: Diagnosis not present

## 2021-05-12 DIAGNOSIS — Z79899 Other long term (current) drug therapy: Secondary | ICD-10-CM | POA: Diagnosis not present

## 2021-05-12 DIAGNOSIS — R52 Pain, unspecified: Secondary | ICD-10-CM | POA: Diagnosis not present

## 2021-05-13 DIAGNOSIS — I509 Heart failure, unspecified: Secondary | ICD-10-CM | POA: Diagnosis not present

## 2021-05-13 DIAGNOSIS — R296 Repeated falls: Secondary | ICD-10-CM | POA: Diagnosis not present

## 2021-05-13 DIAGNOSIS — M6281 Muscle weakness (generalized): Secondary | ICD-10-CM | POA: Diagnosis not present

## 2021-05-14 DIAGNOSIS — I509 Heart failure, unspecified: Secondary | ICD-10-CM | POA: Diagnosis not present

## 2021-05-14 DIAGNOSIS — R296 Repeated falls: Secondary | ICD-10-CM | POA: Diagnosis not present

## 2021-05-14 DIAGNOSIS — M6281 Muscle weakness (generalized): Secondary | ICD-10-CM | POA: Diagnosis not present

## 2021-05-15 DIAGNOSIS — M6281 Muscle weakness (generalized): Secondary | ICD-10-CM | POA: Diagnosis not present

## 2021-05-15 DIAGNOSIS — I509 Heart failure, unspecified: Secondary | ICD-10-CM | POA: Diagnosis not present

## 2021-05-15 DIAGNOSIS — R296 Repeated falls: Secondary | ICD-10-CM | POA: Diagnosis not present

## 2021-05-16 DIAGNOSIS — I509 Heart failure, unspecified: Secondary | ICD-10-CM | POA: Diagnosis not present

## 2021-05-16 DIAGNOSIS — R296 Repeated falls: Secondary | ICD-10-CM | POA: Diagnosis not present

## 2021-05-16 DIAGNOSIS — M6281 Muscle weakness (generalized): Secondary | ICD-10-CM | POA: Diagnosis not present

## 2021-05-17 DIAGNOSIS — M6281 Muscle weakness (generalized): Secondary | ICD-10-CM | POA: Diagnosis not present

## 2021-05-17 DIAGNOSIS — R296 Repeated falls: Secondary | ICD-10-CM | POA: Diagnosis not present

## 2021-05-17 DIAGNOSIS — I509 Heart failure, unspecified: Secondary | ICD-10-CM | POA: Diagnosis not present

## 2021-05-19 DIAGNOSIS — R296 Repeated falls: Secondary | ICD-10-CM | POA: Diagnosis not present

## 2021-05-19 DIAGNOSIS — M6281 Muscle weakness (generalized): Secondary | ICD-10-CM | POA: Diagnosis not present

## 2021-05-19 DIAGNOSIS — I509 Heart failure, unspecified: Secondary | ICD-10-CM | POA: Diagnosis not present

## 2021-05-20 DIAGNOSIS — I509 Heart failure, unspecified: Secondary | ICD-10-CM | POA: Diagnosis not present

## 2021-05-20 DIAGNOSIS — M6281 Muscle weakness (generalized): Secondary | ICD-10-CM | POA: Diagnosis not present

## 2021-05-20 DIAGNOSIS — R296 Repeated falls: Secondary | ICD-10-CM | POA: Diagnosis not present

## 2021-05-21 DIAGNOSIS — I509 Heart failure, unspecified: Secondary | ICD-10-CM | POA: Diagnosis not present

## 2021-05-21 DIAGNOSIS — M6281 Muscle weakness (generalized): Secondary | ICD-10-CM | POA: Diagnosis not present

## 2021-05-21 DIAGNOSIS — R296 Repeated falls: Secondary | ICD-10-CM | POA: Diagnosis not present

## 2021-05-22 DIAGNOSIS — R296 Repeated falls: Secondary | ICD-10-CM | POA: Diagnosis not present

## 2021-05-22 DIAGNOSIS — I509 Heart failure, unspecified: Secondary | ICD-10-CM | POA: Diagnosis not present

## 2021-05-22 DIAGNOSIS — M6281 Muscle weakness (generalized): Secondary | ICD-10-CM | POA: Diagnosis not present

## 2021-05-23 DIAGNOSIS — I509 Heart failure, unspecified: Secondary | ICD-10-CM | POA: Diagnosis not present

## 2021-05-23 DIAGNOSIS — R296 Repeated falls: Secondary | ICD-10-CM | POA: Diagnosis not present

## 2021-05-23 DIAGNOSIS — M6281 Muscle weakness (generalized): Secondary | ICD-10-CM | POA: Diagnosis not present

## 2021-05-26 DIAGNOSIS — R296 Repeated falls: Secondary | ICD-10-CM | POA: Diagnosis not present

## 2021-05-26 DIAGNOSIS — R7309 Other abnormal glucose: Secondary | ICD-10-CM | POA: Diagnosis not present

## 2021-05-26 DIAGNOSIS — E1165 Type 2 diabetes mellitus with hyperglycemia: Secondary | ICD-10-CM | POA: Diagnosis not present

## 2021-05-26 DIAGNOSIS — M6281 Muscle weakness (generalized): Secondary | ICD-10-CM | POA: Diagnosis not present

## 2021-05-26 DIAGNOSIS — I509 Heart failure, unspecified: Secondary | ICD-10-CM | POA: Diagnosis not present

## 2021-05-27 DIAGNOSIS — R296 Repeated falls: Secondary | ICD-10-CM | POA: Diagnosis not present

## 2021-05-27 DIAGNOSIS — I509 Heart failure, unspecified: Secondary | ICD-10-CM | POA: Diagnosis not present

## 2021-05-27 DIAGNOSIS — M6281 Muscle weakness (generalized): Secondary | ICD-10-CM | POA: Diagnosis not present

## 2021-05-27 LAB — BASIC METABOLIC PANEL
BUN: 10 (ref 4–21)
CO2: 25 — AB (ref 13–22)
Chloride: 98 — AB (ref 99–108)
Creatinine: 0.8 (ref 0.6–1.3)
Glucose: 246
Potassium: 4.2 (ref 3.4–5.3)
Sodium: 139 (ref 137–147)

## 2021-05-27 LAB — HEPATIC FUNCTION PANEL
ALT: 9 — AB (ref 10–40)
AST: 10 — AB (ref 14–40)
Bilirubin, Total: 0.4

## 2021-05-27 LAB — COMPREHENSIVE METABOLIC PANEL
Albumin: 3.9 (ref 3.5–5.0)
Calcium: 9.2 (ref 8.7–10.7)
Globulin: 2.8

## 2021-05-27 LAB — HEMOGLOBIN A1C: Hemoglobin A1C: 8.2

## 2021-05-28 ENCOUNTER — Encounter: Payer: Self-pay | Admitting: Nurse Practitioner

## 2021-05-28 DIAGNOSIS — R296 Repeated falls: Secondary | ICD-10-CM | POA: Diagnosis not present

## 2021-05-28 DIAGNOSIS — M6281 Muscle weakness (generalized): Secondary | ICD-10-CM | POA: Diagnosis not present

## 2021-05-28 DIAGNOSIS — I509 Heart failure, unspecified: Secondary | ICD-10-CM | POA: Diagnosis not present

## 2021-05-29 DIAGNOSIS — M6281 Muscle weakness (generalized): Secondary | ICD-10-CM | POA: Diagnosis not present

## 2021-05-29 DIAGNOSIS — R296 Repeated falls: Secondary | ICD-10-CM | POA: Diagnosis not present

## 2021-05-29 DIAGNOSIS — I509 Heart failure, unspecified: Secondary | ICD-10-CM | POA: Diagnosis not present

## 2021-05-30 DIAGNOSIS — I509 Heart failure, unspecified: Secondary | ICD-10-CM | POA: Diagnosis not present

## 2021-05-30 DIAGNOSIS — R296 Repeated falls: Secondary | ICD-10-CM | POA: Diagnosis not present

## 2021-05-30 DIAGNOSIS — M6281 Muscle weakness (generalized): Secondary | ICD-10-CM | POA: Diagnosis not present

## 2021-06-02 DIAGNOSIS — I509 Heart failure, unspecified: Secondary | ICD-10-CM | POA: Diagnosis not present

## 2021-06-02 DIAGNOSIS — E039 Hypothyroidism, unspecified: Secondary | ICD-10-CM | POA: Diagnosis not present

## 2021-06-02 DIAGNOSIS — R296 Repeated falls: Secondary | ICD-10-CM | POA: Diagnosis not present

## 2021-06-02 DIAGNOSIS — I10 Essential (primary) hypertension: Secondary | ICD-10-CM | POA: Diagnosis not present

## 2021-06-02 DIAGNOSIS — M6281 Muscle weakness (generalized): Secondary | ICD-10-CM | POA: Diagnosis not present

## 2021-06-02 DIAGNOSIS — R7309 Other abnormal glucose: Secondary | ICD-10-CM | POA: Diagnosis not present

## 2021-06-02 DIAGNOSIS — E1165 Type 2 diabetes mellitus with hyperglycemia: Secondary | ICD-10-CM | POA: Diagnosis not present

## 2021-06-03 ENCOUNTER — Encounter: Payer: Self-pay | Admitting: Nurse Practitioner

## 2021-06-03 ENCOUNTER — Ambulatory Visit (INDEPENDENT_AMBULATORY_CARE_PROVIDER_SITE_OTHER): Payer: Medicare Other | Admitting: Nurse Practitioner

## 2021-06-03 ENCOUNTER — Other Ambulatory Visit: Payer: Self-pay

## 2021-06-03 VITALS — BP 150/78 | HR 62 | Ht 69.0 in

## 2021-06-03 DIAGNOSIS — E039 Hypothyroidism, unspecified: Secondary | ICD-10-CM

## 2021-06-03 DIAGNOSIS — Z794 Long term (current) use of insulin: Secondary | ICD-10-CM

## 2021-06-03 DIAGNOSIS — M6281 Muscle weakness (generalized): Secondary | ICD-10-CM | POA: Diagnosis not present

## 2021-06-03 DIAGNOSIS — I509 Heart failure, unspecified: Secondary | ICD-10-CM | POA: Diagnosis not present

## 2021-06-03 DIAGNOSIS — E119 Type 2 diabetes mellitus without complications: Secondary | ICD-10-CM

## 2021-06-03 DIAGNOSIS — R296 Repeated falls: Secondary | ICD-10-CM | POA: Diagnosis not present

## 2021-06-03 NOTE — Patient Instructions (Signed)

## 2021-06-03 NOTE — Progress Notes (Signed)
Endocrinology Follow Up Note       06/03/2021, 12:14 PM   Subjective:    Patient ID: John Kirk, male    DOB: [birth year].  John Kirk is being seen in follow up after being seen in consultation for management of currently uncontrolled symptomatic diabetes requested by  Beatrix Fetters, MD.   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) (HCC)    Atrial fibrillation (HCC)    CHF (congestive heart failure) (HCC)    COPD (chronic obstructive pulmonary disease) (HCC)    CVA (cerebral vascular accident) (HCC)    DM2 (diabetes mellitus, type 2) (HCC)    Hypertension    Hypothyroidism    Pneumonia     Past Surgical History:  Procedure Laterality Date   CHOLECYSTECTOMY     LUMBAR LAMINECTOMY     x 2   NEPHROLITHOTOMY Right     Social History   Socioeconomic History   Marital status: Widowed    Spouse name: Not on file   Number of children: 1   Years of education: Not on file   Highest education level: Not on file  Occupational History   Not on file  Tobacco Use   Smoking status: Former    Years: 20.00    Types: Cigarettes   Smokeless tobacco: Former    Types: Chew   Tobacco comments:    smoked from 35yr to 26 yr  Vaping Use   Vaping Use: Never used  Substance and Sexual Activity   Alcohol use: Never   Drug use: Never   Sexual activity: Not on file  Other Topics Concern   Not on file  Social History Narrative   Not on file   Social Determinants of Health   Financial Resource Strain: Not on file  Food Insecurity: Not on file  Transportation Needs: Not on file  Physical Activity: Not on file  Stress: Not on file  Social Connections: Not on file    Family History  Problem Relation Age of Onset   Diabetes Mother    Thyroid disease Mother    Hypertension Mother    Hyperlipidemia Mother    Heart attack Mother    Cancer Mother     Hypertension Father    Cancer Father     Outpatient Encounter Medications as of 06/03/2021  Medication Sig   acetaminophen (TYLENOL) 500 MG tablet Take 1,000 mg by mouth in the morning.   albuterol (VENTOLIN HFA) 108 (90 Base) MCG/ACT inhaler Inhale 2 puffs into the lungs in the morning, at noon, and at bedtime. *May use every 4 hours as needed for wheezing   allopurinol (ZYLOPRIM) 100 MG tablet Take 100 mg by mouth daily.   ALPRAZolam (XANAX) 0.25 MG tablet Take 0.25 mg by mouth 2 (two) times daily as needed for anxiety.   apixaban (ELIQUIS) 5 MG TABS tablet Take 5 mg by mouth 2 (two) times daily.   bisacodyl (DULCOLAX) 10 MG suppository Place 10 mg rectally daily as needed for moderate constipation.   Cholecalciferol (VITAMIN D3) 1.25 MG (50000 UT) CAPS Take 1 capsule by mouth once a week.   divalproex (DEPAKOTE)  125 MG DR tablet Take 125 mg by mouth every 12 (twelve) hours.   DULoxetine (CYMBALTA) 60 MG capsule Take 60 mg by mouth daily.   fenofibrate (TRICOR) 145 MG tablet Take 145 mg by mouth daily.   finasteride (PROSCAR) 5 MG tablet Take 1 tablet (5 mg total) by mouth daily.   furosemide (LASIX) 40 MG tablet Take 40 mg by mouth in the morning.    gabapentin (NEURONTIN) 100 MG capsule Take 100 mg by mouth in the morning and at bedtime.   hydrocortisone 2.5 % cream Apply 1 application topically daily at 6 (six) AM. Sparingly  to face/neck every evening   insulin detemir (LEVEMIR FLEXTOUCH) 100 UNIT/ML FlexPen Inject 70 Units into the skin at bedtime. Prime pen with 2u  prior to each use. Once opened leave at room temp   insulin lispro (HUMALOG) 100 UNIT/ML injection Inject 10 Units into the skin 3 (three) times daily before meals.   isosorbide mononitrate (IMDUR) 30 MG 24 hr tablet Take 30 mg by mouth in the morning.   ketoconazole (NIZORAL) 2 % shampoo Apply 1 application topically 3 (three) times a week. [employer], and Thurs   levETIRAcetam (KEPPRA) 500 MG tablet Take 500 mg by mouth  2 (two) times daily.   levothyroxine (SYNTHROID) 125 MCG tablet Take 125 mcg by mouth daily.   lisinopril (ZESTRIL) 10 MG tablet Take 10 mg by mouth daily.   MAGNESIUM OXIDE PO Take 500 mg by mouth 2 (two) times daily.    metFORMIN (GLUCOPHAGE-XR) 500 MG 24 hr tablet Take 500 mg by mouth every morning.   mirabegron ER (MYRBETRIQ) 50 MG TB24 tablet Take 1 tablet (50 mg total) by mouth daily.   rosuvastatin (CRESTOR) 10 MG tablet Take 10 mg by mouth in the morning.   senna-docusate (SENOKOT-S) 8.6-50 MG tablet Take 2 tablets by mouth 2 (two) times daily.   tamsulosin (FLOMAX) 0.4 MG CAPS capsule Take 0.4 mg by mouth in the morning. *Give after the same meal   ARIPiprazole (ABILIFY) 2 MG tablet Take 2 mg by mouth in the morning.   BD AUTOSHIELD DUO 30G X 5 MM MISC    insulin aspart (NOVOLOG FLEXPEN) 100 UNIT/ML FlexPen Inject 12-18 Units into the skin 3 (three) times daily before meals. 90-150= 12u, 151-200= 13u, 201-250= 14u, 251-300= 15u, 301-350= 16u, 351-400= 17u, >400= 18u. Notify provider if less than 70 or greater than 400 (Patient not taking: Reported on 06/03/2021)   nitroGLYCERIN (NITROSTAT) 0.4 MG SL tablet Place 0.4 mg under the tongue every 5 (five) minutes as needed for chest pain.   OXYGEN Inhale 2 L into the lungs continuous. At Bedtime   [DISCONTINUED] Olopatadine HCl 0.2 % SOLN Place 2 drops into both eyes daily.   [DISCONTINUED] oxyCODONE-acetaminophen (PERCOCET) 5-325 MG tablet Take 1 tablet by mouth every 6 (six) hours as needed for severe pain.   [DISCONTINUED] polyethylene glycol (MIRALAX / GLYCOLAX) 17 g packet Take 17 g by mouth daily. *Mixed in 4-8 ounces of water-drink   [DISCONTINUED] Propylene Glycol (SYSTANE COMPLETE) 0.6 % SOLN Place 1 drop into both eyes in the morning, at noon, in the evening, and at bedtime.   [DISCONTINUED] simethicone (MYLICON) 80 MG chewable tablet Chew 80 mg by mouth in the morning.   No facility-administered encounter medications on file  as of 06/03/2021.    ALLERGIES: Allergies  Allergen Reactions   Celery Oil     Unknown-listed on MAR provided by facility   Morphine And Related  Unknown-listed on MAR provided by facility   Sulfa Antibiotics     VACCINATION STATUS:  There is no immunization history on file for this patient.  Diabetes He presents for his follow-up diabetic visit. He has type 2 diabetes mellitus. Onset time: He was diagnosed at approximate age of 44 years. His disease course has been improving. There are no hypoglycemic associated symptoms. Pertinent negatives for hypoglycemia include no confusion, headaches, nervousness/anxiousness, pallor, seizures or tremors. Associated symptoms include blurred vision, fatigue and foot paresthesias. Pertinent negatives for diabetes include no chest pain, no polydipsia, no polyphagia, no polyuria, no weakness and no weight loss. There are no hypoglycemic complications. Symptoms are stable. Diabetic complications include a CVA, heart disease, nephropathy and peripheral neuropathy. Risk factors for coronary artery disease include male sex, obesity, sedentary lifestyle, tobacco exposure, family history, diabetes mellitus, hypertension and dyslipidemia. Current diabetic treatment includes intensive insulin program and oral agent (monotherapy). He is compliant with treatment all of the time (with assistance from assisted living facility staff). His weight is stable. He is following a generally unhealthy diet. When asked about meal planning, he reported none. He has not had a previous visit with a dietitian. He never participates in exercise. His home blood glucose trend is fluctuating minimally. His breakfast blood glucose range is generally 180-200 mg/dl. His lunch blood glucose range is generally 180-200 mg/dl. His dinner blood glucose range is generally >200 mg/dl. His bedtime blood glucose range is generally >200 mg/dl. His overall blood glucose range is >200 mg/dl. (He presents  today, accompanied by his care attendant from the nursing home, with logs showing persistent hyperglycemia, both fasting and postprandially.  His POCT A1c today is 8.2%, improving slightly from previous visit of 8.7%.  The nursing home had not been doing the SSI insulin in addition to meal coverage.  There are no episodes of hypoglycemia noted.) An ACE inhibitor/angiotensin II receptor blocker is being taken. He sees a podiatrist (Podiatrist comes to ALF periodically).Eye exam is current.  Thyroid Problem Presents for follow-up visit. Onset time: Patient does not recall the circumstance of his diagnosis with hypothyroidism. Symptoms include fatigue. Patient reports no anxiety, cold intolerance, constipation, depressed mood, diarrhea, heat intolerance, palpitations, tremors, weight gain or weight loss. The symptoms have been stable. Past treatments include levothyroxine (Is currently on levothyroxine 125 mg p.o. daily.). The following procedures have not been performed: radioiodine uptake scan and thyroidectomy. His past medical history is significant for diabetes, heart failure and obesity.  Hypertension This is a chronic problem. The current episode started more than 1 year ago. The problem is unchanged. The problem is controlled. Associated symptoms include blurred vision. Pertinent negatives include no chest pain, headaches, neck pain, palpitations or shortness of breath. Agents associated with hypertension include thyroid hormones. Risk factors for coronary artery disease include diabetes mellitus, male gender, obesity, sedentary lifestyle, smoking/tobacco exposure, family history and dyslipidemia. Past treatments include ACE inhibitors and diuretics. The current treatment provides mild improvement. There are no compliance problems.  Hypertensive end-organ damage includes kidney disease, CAD/MI, CVA and heart failure. Identifiable causes of hypertension include chronic renal disease and a thyroid problem.    Review of systems  Constitutional: + Minimally fluctuating body weight,  current Body mass index is 36.48 kg/m. , + fatigue, no subjective hyperthermia, no subjective hypothermia Eyes: + blurry vision- improving, no xerophthalmia ENT: no sore throat, no nodules palpated in throat, no dysphagia/odynophagia, no hoarseness Cardiovascular: no chest pain, no shortness of breath, no palpitations Respiratory: no cough, no  shortness of breath Gastrointestinal: no nausea/vomiting/diarrhea Musculoskeletal: no muscle/joint aches, wheelchair bound due to previous CVA, disequilibrium, deconditioning Skin: no rashes, + hyperemia to BLE Neurological: no tremors, no numbness, no tingling, no dizziness Psychiatric: no depression, no anxiety    Objective:    BP (!) 150/78   Pulse 62   Ht 5\' 9"  (1.753 m)   BMI 36.48 kg/m   Wt Readings from Last 3 Encounters:  01/06/21 247 lb (112 kg)  12/31/20 249 lb (112.9 kg)  12/04/20 234 lb (106.1 kg)    BP Readings from Last 3 Encounters:  06/03/21 (!) 150/78  05/08/21 (!) 169/77  03/03/21 139/79     Physical Exam- Limited  Constitutional:  Body mass index is 36.48 kg/m. , not in acute distress, normal state of mind Eyes:  EOMI, no exophthalmos Neck: Supple Cardiovascular: RRR, no murmurs, rubs, or gallops, no edema Respiratory: Adequate breathing efforts, no crackles, rales, rhonchi, or wheezing Musculoskeletal: no gross deformities, WC bound due to previous CVA Skin:  no rashes, + hyperemia to BLE Neurological: no tremor with outstretched hands   Diabetic Foot Exam - Simple   Simple Foot Form Diabetic Foot exam was performed with the following findings: Yes 06/03/2021 11:14 AM  Visual Inspection See comments: Yes Sensation Testing See comments: Yes Pulse Check Posterior Tibialis and Dorsalis pulse intact bilaterally: Yes Comments Onychomycosis bilaterally, decreased sensation to monofilament tool bilaterally, toenails in need of  trim      CMP ( most recent) CMP     Component Value Date/Time   NA 139 05/27/2021 0000   K 4.2 05/27/2021 0000   CL 98 (A) 05/27/2021 0000   CO2 25 (A) 05/27/2021 0000   GLUCOSE 115 (H) 12/31/2020 0846   BUN 10 05/27/2021 0000   CREATININE 0.8 05/27/2021 0000   CREATININE 0.97 12/31/2020 0846   CALCIUM 9.2 05/27/2021 0000   PROT 6.9 12/31/2020 0846   ALBUMIN 3.9 05/27/2021 0000   AST 10 (A) 05/27/2021 0000   ALT 9 (A) 05/27/2021 0000   ALKPHOS 28 (L) 12/31/2020 0846   BILITOT 0.6 12/31/2020 0846   GFRNONAA >60 12/31/2020 0846   GFRAA 77 11/13/2020 0000     Diabetic Labs (most recent): Lab Results  Component Value Date   HGBA1C 8.2 05/27/2021   HGBA1C 8.7 (A) 03/03/2021   HGBA1C 9.5 (A) 12/04/2020     Lab Results  Component Value Date   TSH 2.74 11/13/2020   TSH 1.70 03/20/2020      Assessment & Plan:   1) Type 2 diabetes mellitus without complication, with long-term current use   - John Kirk has currently uncontrolled symptomatic type 2 DM since 80 years of age.   Recent labs reviewed.  He presents today, accompanied by his care attendant from the nursing home, with logs showing persistent hyperglycemia, both fasting and postprandially.  His POCT A1c today is 8.2%, improving slightly from previous visit of 8.7%.  The nursing home had not been doing the SSI insulin in addition to meal coverage.  There are no episodes of hypoglycemia noted.  - I had a long discussion with him about the progressive nature of diabetes and the pathology behind its complications. -his diabetes is complicated by CVA, obesity/sedentary life and he remains at a high risk for more acute and chronic complications which include CAD, CVA, CKD, retinopathy, and neuropathy. These are all discussed in detail with him.  - Nutritional counseling repeated at each appointment due to patients tendency to fall back  in to old habits.  - The patient admits there is a room for improvement in  their diet and drink choices. -  Suggestion is made for the patient to avoid simple carbohydrates from their diet including Cakes, Sweet Desserts / Pastries, Ice Cream, Soda (diet and regular), Sweet Tea, Candies, Chips, Cookies, Sweet Pastries, Store Bought Juices, Alcohol in Excess of 1-2 drinks a day, Artificial Sweeteners, Coffee Creamer, and "Sugar-free" Products. This will help patient to have stable blood glucose profile and potentially avoid unintended weight gain.   - I encouraged the patient to switch to unprocessed or minimally processed complex starch and increased protein intake (animal or plant source), fruits, and vegetables.   - Patient is advised to stick to a routine mealtimes to eat 3 meals a day and avoid unnecessary snacks (to snack only to correct hypoglycemia).  - I have approached him with the following individualized plan to manage  his diabetes and patient agrees:   -In light of his chronic glycemic burden, he will need intensive treatment with basal/bolus insulin in order for him to achieve and maintain control of diabetes to target.  -He is advised to continue Levemir 70 units SQ nightly.  The nursing home had not been doing the sliding scale insulin in addition to his meal coverage.  They are advised to restart Humalog 12-18 units TID with meals if glucose is above 90 and he is eating. He is also advised to continue Metformin 500 mg ER daily with breakfast.   -He is encouraged to continue monitoring glucose 4 times daily, before meals and before bed, and call the clinic if he has readings less than 70 or greater than 300 for 3 tests in a row.  -I also asked that he be added to the foot care list at the nursing home for routine diabetic foot care.  - Specific targets for  A1c;  LDL, HDL,  and Triglycerides were discussed with the patient.  2) Blood Pressure /Hypertension:   his blood pressure is controlled to target.   he is advised to continue his current medications  including Lisinopril 10 mg p.o. daily with breakfast and Lasix 40 mg po daily.  3) Lipids/Hyperlipidemia:  His recent lipid panel from 11/04/20 shows controlled LDL of 75 and slightly elevated triglycerides of 156.  He is advised to continue Crestor 10 mg po daily at bedtime.  Side effects and precautions discussed with him.   4)  Weight/Diet:  His Body mass index is 36.48 kg/m.  -   clearly complicating his diabetes care.   he is  a candidate for weight loss. I discussed with him the fact that loss of 5 - 10% of his  current body weight will have the most impact on his diabetes management.  Exercise, and detailed carbohydrates information provided  -  detailed on discharge instructions.  5) Hypothyroidism: -There are no recent TFTs to review.  He is advised to continue Levothyroxine 125 mcg po daily before breakfast.  Will recheck TFTs on subsequent visits.   - We discussed about the correct intake of his thyroid hormone, on empty stomach at fasting, with water, separated by at least 30 minutes from breakfast and other medications,  and separated by more than 4 hours from calcium, iron, multivitamins, acid reflux medications (PPIs). -Patient is made aware of the fact that thyroid hormone replacement is needed for life, dose to be adjusted by periodic monitoring of thyroid function tests.  6) Chronic Care/Health Maintenance: -he is on  ACEI medications and is encouraged to initiate and continue to follow up with Ophthalmology, Dentist, Podiatrist at least yearly or according to recommendations, and advised to stay away from smoking. I have recommended yearly flu vaccine and pneumonia vaccine at least every 5 years; moderate intensity exercise for up to 150 minutes weekly; and  sleep for at least 7 hours a day.  - he is advised to maintain close follow up with Kotturi, Zadie Rhine, MD for primary care needs, as well as his other providers for optimal and coordinated care.      I spent 44 minutes in  the care of the patient today including review of labs from CMP, Lipids, Thyroid Function, Hematology (current and previous including abstractions from other facilities); face-to-face time discussing  his blood glucose readings/logs, discussing hypoglycemia and hyperglycemia episodes and symptoms, medications doses, his options of short and long term treatment based on the latest standards of care / guidelines;  discussion about incorporating lifestyle medicine;  and documenting the encounter.    Please refer to Patient Instructions for Blood Glucose Monitoring and Insulin/Medications Dosing Guide"  in media tab for additional information. Please  also refer to " Patient Self Inventory" in the Media  tab for reviewed elements of pertinent patient history.  John Kirk participated in the discussions, expressed understanding, and voiced agreement with the above plans.  All questions were answered to his satisfaction. he is encouraged to contact clinic should he have any questions or concerns prior to his return visit.  Follow up plan: - Return in about 3 months (around 09/03/2021) for Diabetes F/U with A1c in office, No previsit labs, Bring meter and logs.  Ronny Bacon, [facility] [address] [place], [place] [address] Phone: [phone] Fax: [phone]  06/03/2021, 12:14 PM

## 2021-06-05 DIAGNOSIS — R296 Repeated falls: Secondary | ICD-10-CM | POA: Diagnosis not present

## 2021-06-05 DIAGNOSIS — M6281 Muscle weakness (generalized): Secondary | ICD-10-CM | POA: Diagnosis not present

## 2021-06-05 DIAGNOSIS — I509 Heart failure, unspecified: Secondary | ICD-10-CM | POA: Diagnosis not present

## 2021-06-06 DIAGNOSIS — M6281 Muscle weakness (generalized): Secondary | ICD-10-CM | POA: Diagnosis not present

## 2021-06-06 DIAGNOSIS — M25551 Pain in right hip: Secondary | ICD-10-CM | POA: Diagnosis not present

## 2021-06-06 DIAGNOSIS — W19XXXA Unspecified fall, initial encounter: Secondary | ICD-10-CM | POA: Diagnosis not present

## 2021-06-06 DIAGNOSIS — I509 Heart failure, unspecified: Secondary | ICD-10-CM | POA: Diagnosis not present

## 2021-06-06 DIAGNOSIS — R296 Repeated falls: Secondary | ICD-10-CM | POA: Diagnosis not present

## 2021-06-09 DIAGNOSIS — R296 Repeated falls: Secondary | ICD-10-CM | POA: Diagnosis not present

## 2021-06-09 DIAGNOSIS — M6281 Muscle weakness (generalized): Secondary | ICD-10-CM | POA: Diagnosis not present

## 2021-06-09 DIAGNOSIS — M79604 Pain in right leg: Secondary | ICD-10-CM | POA: Diagnosis not present

## 2021-06-09 DIAGNOSIS — W19XXXA Unspecified fall, initial encounter: Secondary | ICD-10-CM | POA: Diagnosis not present

## 2021-06-09 DIAGNOSIS — I509 Heart failure, unspecified: Secondary | ICD-10-CM | POA: Diagnosis not present

## 2021-06-10 DIAGNOSIS — R296 Repeated falls: Secondary | ICD-10-CM | POA: Diagnosis not present

## 2021-06-10 DIAGNOSIS — I509 Heart failure, unspecified: Secondary | ICD-10-CM | POA: Diagnosis not present

## 2021-06-10 DIAGNOSIS — M6281 Muscle weakness (generalized): Secondary | ICD-10-CM | POA: Diagnosis not present

## 2021-06-11 DIAGNOSIS — I509 Heart failure, unspecified: Secondary | ICD-10-CM | POA: Diagnosis not present

## 2021-06-11 DIAGNOSIS — R296 Repeated falls: Secondary | ICD-10-CM | POA: Diagnosis not present

## 2021-06-11 DIAGNOSIS — M6281 Muscle weakness (generalized): Secondary | ICD-10-CM | POA: Diagnosis not present

## 2021-06-12 DIAGNOSIS — R296 Repeated falls: Secondary | ICD-10-CM | POA: Diagnosis not present

## 2021-06-12 DIAGNOSIS — M6281 Muscle weakness (generalized): Secondary | ICD-10-CM | POA: Diagnosis not present

## 2021-06-12 DIAGNOSIS — I509 Heart failure, unspecified: Secondary | ICD-10-CM | POA: Diagnosis not present

## 2021-06-13 DIAGNOSIS — R296 Repeated falls: Secondary | ICD-10-CM | POA: Diagnosis not present

## 2021-06-13 DIAGNOSIS — M79604 Pain in right leg: Secondary | ICD-10-CM | POA: Diagnosis not present

## 2021-06-13 DIAGNOSIS — W19XXXA Unspecified fall, initial encounter: Secondary | ICD-10-CM | POA: Diagnosis not present

## 2021-06-13 DIAGNOSIS — I509 Heart failure, unspecified: Secondary | ICD-10-CM | POA: Diagnosis not present

## 2021-06-13 DIAGNOSIS — M6281 Muscle weakness (generalized): Secondary | ICD-10-CM | POA: Diagnosis not present

## 2021-06-24 DIAGNOSIS — Z79899 Other long term (current) drug therapy: Secondary | ICD-10-CM | POA: Diagnosis not present

## 2021-07-02 DIAGNOSIS — I10 Essential (primary) hypertension: Secondary | ICD-10-CM | POA: Diagnosis not present

## 2021-07-02 DIAGNOSIS — J449 Chronic obstructive pulmonary disease, unspecified: Secondary | ICD-10-CM | POA: Diagnosis not present

## 2021-07-02 DIAGNOSIS — R300 Dysuria: Secondary | ICD-10-CM | POA: Diagnosis not present

## 2021-07-02 DIAGNOSIS — E119 Type 2 diabetes mellitus without complications: Secondary | ICD-10-CM | POA: Diagnosis not present

## 2021-07-02 DIAGNOSIS — I4891 Unspecified atrial fibrillation: Secondary | ICD-10-CM | POA: Diagnosis not present

## 2021-07-07 DIAGNOSIS — Z79899 Other long term (current) drug therapy: Secondary | ICD-10-CM | POA: Diagnosis not present

## 2021-07-07 DIAGNOSIS — N390 Urinary tract infection, site not specified: Secondary | ICD-10-CM | POA: Diagnosis not present

## 2021-07-11 DIAGNOSIS — Z79899 Other long term (current) drug therapy: Secondary | ICD-10-CM | POA: Diagnosis not present

## 2021-07-30 DIAGNOSIS — M6281 Muscle weakness (generalized): Secondary | ICD-10-CM | POA: Diagnosis not present

## 2021-07-30 DIAGNOSIS — I509 Heart failure, unspecified: Secondary | ICD-10-CM | POA: Diagnosis not present

## 2021-07-30 DIAGNOSIS — R296 Repeated falls: Secondary | ICD-10-CM | POA: Diagnosis not present

## 2021-07-31 DIAGNOSIS — E1140 Type 2 diabetes mellitus with diabetic neuropathy, unspecified: Secondary | ICD-10-CM | POA: Diagnosis not present

## 2021-07-31 DIAGNOSIS — L97521 Non-pressure chronic ulcer of other part of left foot limited to breakdown of skin: Secondary | ICD-10-CM | POA: Diagnosis not present

## 2021-07-31 DIAGNOSIS — L602 Onychogryphosis: Secondary | ICD-10-CM | POA: Diagnosis not present

## 2021-07-31 DIAGNOSIS — M6281 Muscle weakness (generalized): Secondary | ICD-10-CM | POA: Diagnosis not present

## 2021-07-31 DIAGNOSIS — I509 Heart failure, unspecified: Secondary | ICD-10-CM | POA: Diagnosis not present

## 2021-07-31 DIAGNOSIS — R296 Repeated falls: Secondary | ICD-10-CM | POA: Diagnosis not present

## 2021-08-01 DIAGNOSIS — R296 Repeated falls: Secondary | ICD-10-CM | POA: Diagnosis not present

## 2021-08-01 DIAGNOSIS — M6281 Muscle weakness (generalized): Secondary | ICD-10-CM | POA: Diagnosis not present

## 2021-08-01 DIAGNOSIS — I509 Heart failure, unspecified: Secondary | ICD-10-CM | POA: Diagnosis not present

## 2021-08-04 DIAGNOSIS — M6281 Muscle weakness (generalized): Secondary | ICD-10-CM | POA: Diagnosis not present

## 2021-08-04 DIAGNOSIS — R7309 Other abnormal glucose: Secondary | ICD-10-CM | POA: Diagnosis not present

## 2021-08-04 DIAGNOSIS — I509 Heart failure, unspecified: Secondary | ICD-10-CM | POA: Diagnosis not present

## 2021-08-04 DIAGNOSIS — Z79899 Other long term (current) drug therapy: Secondary | ICD-10-CM | POA: Diagnosis not present

## 2021-08-04 DIAGNOSIS — E119 Type 2 diabetes mellitus without complications: Secondary | ICD-10-CM | POA: Diagnosis not present

## 2021-08-04 DIAGNOSIS — R296 Repeated falls: Secondary | ICD-10-CM | POA: Diagnosis not present

## 2021-08-05 DIAGNOSIS — R52 Pain, unspecified: Secondary | ICD-10-CM | POA: Diagnosis not present

## 2021-08-05 DIAGNOSIS — I509 Heart failure, unspecified: Secondary | ICD-10-CM | POA: Diagnosis not present

## 2021-08-05 DIAGNOSIS — M6281 Muscle weakness (generalized): Secondary | ICD-10-CM | POA: Diagnosis not present

## 2021-08-05 DIAGNOSIS — R296 Repeated falls: Secondary | ICD-10-CM | POA: Diagnosis not present

## 2021-08-05 DIAGNOSIS — Z79899 Other long term (current) drug therapy: Secondary | ICD-10-CM | POA: Diagnosis not present

## 2021-08-06 DIAGNOSIS — I509 Heart failure, unspecified: Secondary | ICD-10-CM | POA: Diagnosis not present

## 2021-08-06 DIAGNOSIS — R296 Repeated falls: Secondary | ICD-10-CM | POA: Diagnosis not present

## 2021-08-06 DIAGNOSIS — M6281 Muscle weakness (generalized): Secondary | ICD-10-CM | POA: Diagnosis not present

## 2021-08-07 ENCOUNTER — Encounter (HOSPITAL_COMMUNITY): Payer: Self-pay | Admitting: *Deleted

## 2021-08-07 ENCOUNTER — Emergency Department (HOSPITAL_COMMUNITY): Payer: Medicare Other

## 2021-08-07 ENCOUNTER — Emergency Department (HOSPITAL_COMMUNITY)
Admission: EM | Admit: 2021-08-07 | Discharge: 2021-08-07 | Disposition: A | Discharge status: Home / Self Care | Payer: Medicare Other | Attending: Emergency Medicine | Admitting: Emergency Medicine

## 2021-08-07 ENCOUNTER — Other Ambulatory Visit: Payer: Self-pay

## 2021-08-07 DIAGNOSIS — J449 Chronic obstructive pulmonary disease, unspecified: Secondary | ICD-10-CM | POA: Diagnosis not present

## 2021-08-07 DIAGNOSIS — I4891 Unspecified atrial fibrillation: Secondary | ICD-10-CM | POA: Diagnosis not present

## 2021-08-07 DIAGNOSIS — I509 Heart failure, unspecified: Secondary | ICD-10-CM | POA: Diagnosis not present

## 2021-08-07 DIAGNOSIS — R531 Weakness: Secondary | ICD-10-CM | POA: Diagnosis not present

## 2021-08-07 DIAGNOSIS — Z87891 Personal history of nicotine dependence: Secondary | ICD-10-CM | POA: Diagnosis not present

## 2021-08-07 DIAGNOSIS — W19XXXA Unspecified fall, initial encounter: Secondary | ICD-10-CM

## 2021-08-07 DIAGNOSIS — S0990XA Unspecified injury of head, initial encounter: Secondary | ICD-10-CM

## 2021-08-07 DIAGNOSIS — R9082 White matter disease, unspecified: Secondary | ICD-10-CM | POA: Diagnosis not present

## 2021-08-07 DIAGNOSIS — F0390 Unspecified dementia without behavioral disturbance: Secondary | ICD-10-CM | POA: Insufficient documentation

## 2021-08-07 DIAGNOSIS — Z794 Long term (current) use of insulin: Secondary | ICD-10-CM | POA: Diagnosis not present

## 2021-08-07 DIAGNOSIS — Z79899 Other long term (current) drug therapy: Secondary | ICD-10-CM | POA: Diagnosis not present

## 2021-08-07 DIAGNOSIS — E039 Hypothyroidism, unspecified: Secondary | ICD-10-CM | POA: Diagnosis not present

## 2021-08-07 DIAGNOSIS — I110 Hypertensive heart disease with heart failure: Secondary | ICD-10-CM | POA: Insufficient documentation

## 2021-08-07 DIAGNOSIS — Z7901 Long term (current) use of anticoagulants: Secondary | ICD-10-CM | POA: Diagnosis not present

## 2021-08-07 DIAGNOSIS — Z743 Need for continuous supervision: Secondary | ICD-10-CM | POA: Diagnosis not present

## 2021-08-07 DIAGNOSIS — Z7984 Long term (current) use of oral hypoglycemic drugs: Secondary | ICD-10-CM | POA: Insufficient documentation

## 2021-08-07 DIAGNOSIS — S0083XA Contusion of other part of head, initial encounter: Secondary | ICD-10-CM | POA: Diagnosis not present

## 2021-08-07 DIAGNOSIS — R296 Repeated falls: Secondary | ICD-10-CM | POA: Diagnosis not present

## 2021-08-07 DIAGNOSIS — E119 Type 2 diabetes mellitus without complications: Secondary | ICD-10-CM | POA: Insufficient documentation

## 2021-08-07 DIAGNOSIS — S199XXA Unspecified injury of neck, initial encounter: Secondary | ICD-10-CM | POA: Diagnosis not present

## 2021-08-07 DIAGNOSIS — M160 Bilateral primary osteoarthritis of hip: Secondary | ICD-10-CM | POA: Diagnosis not present

## 2021-08-07 DIAGNOSIS — W06XXXA Fall from bed, initial encounter: Secondary | ICD-10-CM | POA: Diagnosis not present

## 2021-08-07 DIAGNOSIS — I959 Hypotension, unspecified: Secondary | ICD-10-CM | POA: Diagnosis not present

## 2021-08-07 DIAGNOSIS — M6281 Muscle weakness (generalized): Secondary | ICD-10-CM | POA: Diagnosis not present

## 2021-08-07 DIAGNOSIS — R0902 Hypoxemia: Secondary | ICD-10-CM | POA: Diagnosis not present

## 2021-08-07 DIAGNOSIS — R5381 Other malaise: Secondary | ICD-10-CM | POA: Diagnosis not present

## 2021-08-07 HISTORY — DX: Gout, unspecified: M10.9

## 2021-08-07 HISTORY — DX: Benign prostatic hyperplasia without lower urinary tract symptoms: N40.0

## 2021-08-07 HISTORY — DX: Unspecified convulsions: R56.9

## 2021-08-07 HISTORY — DX: Anxiety disorder, unspecified: F41.9

## 2021-08-07 HISTORY — DX: Unspecified hearing loss, unspecified ear: H91.90

## 2021-08-07 HISTORY — DX: Bipolar disorder, unspecified: F31.9

## 2021-08-07 NOTE — ED Notes (Signed)
Patient transported to CT 

## 2021-08-07 NOTE — ED Triage Notes (Signed)
Pt brought in by [facility] from [facility] with c/o fall today while getting out of bed. Pt fell back and hit his head. Pt is on ASA. Denies LOC.

## 2021-08-07 NOTE — Discharge Instructions (Signed)
Work-up CT head and neck x-rays of both hips and pelvis without any acute injuries.  Patient stable for discharge back to nursing facility.

## 2021-08-07 NOTE — ED Provider Notes (Addendum)
[facility] EMERGENCY DEPARTMENT Provider Note   CSN: [id number] Arrival date & time: 08/07/21  1326     History Chief Complaint  Patient presents with   John Kirk SLADE PIERPOINT is a 80 y.o. male.  Patient from [facility] nursing facility.  Had a fall today when getting out of bed.  Larey Seat and hit the back of his head.  Patient is on Eliquis for atrial fibrillation.  Patient also has a history of bipolar disorder.  Had a CVA in the past.  Has diabetes hypertension.  Patient's mental status is reportedly baseline for him which would imply that there is some degree of dementia.  Patient also has a history of hypothyroidism.      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) (HCC)    Anxiety    Atrial fibrillation (HCC)    Bipolar disorder (HCC)    BPH (benign prostatic hyperplasia)    CHF (congestive heart failure) (HCC)    COPD (chronic obstructive pulmonary disease) (HCC)    CVA (cerebral vascular accident) (HCC)    DM2 (diabetes mellitus, type 2) (HCC)    Gout    HOH (hard of hearing)    Hypertension    Hypothyroidism    Pneumonia    Seizures (HCC)     Patient Active Problem List   Diagnosis Date Noted   Type 2 diabetes mellitus without complication, with long-term current use of insulin (HCC) 08/01/2020   Hypothyroidism 08/01/2020    Past Surgical History:  Procedure Laterality Date   CHOLECYSTECTOMY     LUMBAR LAMINECTOMY     x 2   NEPHROLITHOTOMY Right        Family History  Problem Relation Age of Onset   Diabetes Mother    Thyroid disease Mother    Hypertension Mother    Hyperlipidemia Mother    Heart attack Mother    Cancer Mother    Hypertension Father    Cancer Father     Social History   Tobacco Use   Smoking status: Former    Years: 20.00    Types: Cigarettes   Smokeless tobacco: Former    Types: Chew   Tobacco comments:    smoked from 89yr to 26 yr  Vaping Use   Vaping Use:  Never used  Substance Use Topics   Alcohol use: Never   Drug use: Never    Home Medications Prior to Admission medications   Medication Sig Start Date End Date Taking? Authorizing Provider  acetaminophen (TYLENOL) 500 MG tablet Take 1,000 mg by mouth in the morning.    [provider]  albuterol (VENTOLIN HFA) 108 (90 Base) MCG/ACT inhaler Inhale 2 puffs into the lungs in the morning, at noon, and at bedtime. *May use every 4 hours as needed for wheezing    [provider]  allopurinol (ZYLOPRIM) 100 MG tablet Take 100 mg by mouth daily.    [provider]  ALPRAZolam Prudy Feeler) 0.25 MG tablet Take 0.25 mg by mouth 2 (two) times daily as needed for anxiety. 06/04/20   [provider]  apixaban (ELIQUIS) 5 MG TABS tablet Take 5 mg by mouth 2 (two) times daily.    [provider]  ARIPiprazole (ABILIFY) 2 MG tablet Take 2 mg by mouth in the morning.    [provider]  BD AUTOSHIELD DUO 30G X 5 MM MISC  06/19/20   [provider]  bisacodyl (DULCOLAX)  10 MG suppository Place 10 mg rectally daily as needed for moderate constipation.    [provider]  Cholecalciferol (VITAMIN D3) 1.25 MG (50000 UT) CAPS Take 1 capsule by mouth once a week.    [provider]  divalproex (DEPAKOTE) 125 MG DR tablet Take 125 mg by mouth every 12 (twelve) hours.    [provider]  DULoxetine (CYMBALTA) 60 MG capsule Take 60 mg by mouth daily. 06/10/20   [provider]  fenofibrate (TRICOR) 145 MG tablet Take 145 mg by mouth daily. 11/29/20   [provider]  finasteride (PROSCAR) 5 MG tablet Take 1 tablet (5 mg total) by mouth daily. 05/08/21   Bjorn Pippin, MD  furosemide (LASIX) 40 MG tablet Take 40 mg by mouth in the morning.     [provider]  gabapentin (NEURONTIN) 100 MG capsule Take 100 mg by mouth in the morning and at bedtime.    [provider]  hydrocortisone 2.5 % cream Apply 1  application topically daily at 6 (six) AM. Sparingly  to face/neck every evening    [provider]  insulin aspart (NOVOLOG FLEXPEN) 100 UNIT/ML FlexPen Inject 12-18 Units into the skin 3 (three) times daily before meals. 90-150= 12u, 151-200= 13u, 201-250= 14u, 251-300= 15u, 301-350= 16u, 351-400= 17u, >400= 18u. Notify provider if less than 70 or greater than 400 Patient not taking: Reported on 06/03/2021    [provider]  insulin detemir (LEVEMIR FLEXTOUCH) 100 UNIT/ML FlexPen Inject 70 Units into the skin at bedtime. Prime pen with 2u  prior to each use. Once opened leave at room temp    [provider]  insulin lispro (HUMALOG) 100 UNIT/ML injection Inject 10 Units into the skin 3 (three) times daily before meals.    [provider]  isosorbide mononitrate (IMDUR) 30 MG 24 hr tablet Take 30 mg by mouth in the morning.    [provider]  ketoconazole (NIZORAL) 2 % shampoo Apply 1 application topically 3 (three) times a week. Evern Bio, and Thurs 06/11/20   [provider]  levETIRAcetam (KEPPRA) 500 MG tablet Take 500 mg by mouth 2 (two) times daily.    [provider]  levothyroxine (SYNTHROID) 125 MCG tablet Take 125 mcg by mouth daily. 04/11/20   [provider]  lisinopril (ZESTRIL) 10 MG tablet Take 10 mg by mouth daily. 06/09/20   [provider]  MAGNESIUM OXIDE PO Take 500 mg by mouth 2 (two) times daily.     [provider]  metFORMIN (GLUCOPHAGE-XR) 500 MG 24 hr tablet Take 500 mg by mouth every morning. 12/27/20   [provider]  mirabegron ER (MYRBETRIQ) 50 MG TB24 tablet Take 1 tablet (50 mg total) by mouth daily. 06/28/20   Bjorn Pippin, MD  nitroGLYCERIN (NITROSTAT) 0.4 MG SL tablet Place 0.4 mg under the tongue every 5 (five) minutes as needed for chest pain.    [provider]  OXYGEN Inhale 2 L into the lungs continuous. At Bedtime    [provider]  rosuvastatin  (CRESTOR) 10 MG tablet Take 10 mg by mouth in the morning.    [provider]  senna-docusate (SENOKOT-S) 8.6-50 MG tablet Take 2 tablets by mouth 2 (two) times daily.    [provider]  tamsulosin (FLOMAX) 0.4 MG CAPS capsule Take 0.4 mg by mouth in the morning. *Give after the same meal    [provider]    Allergies  Celery oil, Morphine and related, and Sulfa antibiotics  Review of Systems   Review of Systems  Unable to perform ROS: Dementia  All other systems reviewed and are negative.  Physical Exam Updated Vital Signs BP (!) 98/50   Pulse (!) 55   Temp 97.9 F (36.6 C) (Oral)   Resp 12   Ht 1.753 m (5\' 9" )   Wt 106.6 kg   SpO2 96%   BMI 34.70 kg/m   Physical Exam Vitals and nursing note reviewed.  Constitutional:      Appearance: He is well-developed.  HENT:     Head: Normocephalic.     Comments: Patient to the top of the head back of the head has a contusion no laceration measuring about 3 cm Eyes:     Extraocular Movements: Extraocular movements intact.     Conjunctiva/sclera: Conjunctivae normal.     Pupils: Pupils are equal, round, and reactive to light.  Cardiovascular:     Rate and Rhythm: Normal rate and regular rhythm.     Heart sounds: No murmur heard. Pulmonary:     Effort: Pulmonary effort is normal. No respiratory distress.     Breath sounds: Normal breath sounds.  Abdominal:     Palpations: Abdomen is soft.     Tenderness: There is no abdominal tenderness.  Musculoskeletal:        General: Tenderness present. Normal range of motion.     Cervical back: Normal range of motion and neck supple.     Comments: Also patient seems to have some discomfort without deformity with movement of the right hip area.  No lower extremity swelling.  Dorsalis pedis pulse distally 1+.  No tenderness to the lumbar thoracic spine area.  Skin:    General: Skin is warm and dry.  Neurological:     Mental Status: He is alert. Mental  status is at baseline.    ED Results / Procedures / Treatments   Labs (all labs ordered are listed, but only abnormal results are displayed) Labs Reviewed - No data to display  EKG None  Radiology No results found.  Procedures Procedures   Medications Ordered in ED Medications - No data to display  ED Course  I have reviewed the triage vital signs and the nursing notes.  Pertinent labs & imaging results that were available during my care of the patient were reviewed by me and considered in my medical decision making (see chart for details).    MDM Rules/Calculators/A&P                           CT head CT cervical spine still in process.  X-rays of pelvis and both hips.  Without any acute findings or any findings worrisome for hip fracture or dislocation.  There are degenerative changes to both hips.  Disposition pending CT head CT cervical spine results.  Clinically appears fairly stable.  CT scan of head and cervical spine without any acute findings.   Final Clinical Impression(s) / ED Diagnoses Final diagnoses:  Fall, initial encounter  Injury of head, initial encounter    Rx / DC Orders ED Discharge Orders     None        11-07-1971, MD 08/07/21 1523    08/09/21, MD 08/07/21 1556

## 2021-08-08 DIAGNOSIS — R296 Repeated falls: Secondary | ICD-10-CM | POA: Diagnosis not present

## 2021-08-08 DIAGNOSIS — I10 Essential (primary) hypertension: Secondary | ICD-10-CM | POA: Diagnosis not present

## 2021-08-08 DIAGNOSIS — I509 Heart failure, unspecified: Secondary | ICD-10-CM | POA: Diagnosis not present

## 2021-08-08 DIAGNOSIS — I4891 Unspecified atrial fibrillation: Secondary | ICD-10-CM | POA: Diagnosis not present

## 2021-08-08 DIAGNOSIS — J449 Chronic obstructive pulmonary disease, unspecified: Secondary | ICD-10-CM | POA: Diagnosis not present

## 2021-08-08 DIAGNOSIS — M6281 Muscle weakness (generalized): Secondary | ICD-10-CM | POA: Diagnosis not present

## 2021-08-11 DIAGNOSIS — R296 Repeated falls: Secondary | ICD-10-CM | POA: Diagnosis not present

## 2021-08-11 DIAGNOSIS — I509 Heart failure, unspecified: Secondary | ICD-10-CM | POA: Diagnosis not present

## 2021-08-11 DIAGNOSIS — M6281 Muscle weakness (generalized): Secondary | ICD-10-CM | POA: Diagnosis not present

## 2021-08-12 DIAGNOSIS — Z79899 Other long term (current) drug therapy: Secondary | ICD-10-CM | POA: Diagnosis not present

## 2021-08-12 DIAGNOSIS — I509 Heart failure, unspecified: Secondary | ICD-10-CM | POA: Diagnosis not present

## 2021-08-12 DIAGNOSIS — M6281 Muscle weakness (generalized): Secondary | ICD-10-CM | POA: Diagnosis not present

## 2021-08-12 DIAGNOSIS — R296 Repeated falls: Secondary | ICD-10-CM | POA: Diagnosis not present

## 2021-08-13 DIAGNOSIS — R296 Repeated falls: Secondary | ICD-10-CM | POA: Diagnosis not present

## 2021-08-13 DIAGNOSIS — I509 Heart failure, unspecified: Secondary | ICD-10-CM | POA: Diagnosis not present

## 2021-08-13 DIAGNOSIS — M6281 Muscle weakness (generalized): Secondary | ICD-10-CM | POA: Diagnosis not present

## 2021-08-14 ENCOUNTER — Encounter: Payer: Self-pay | Admitting: Urology

## 2021-08-14 ENCOUNTER — Ambulatory Visit (INDEPENDENT_AMBULATORY_CARE_PROVIDER_SITE_OTHER): Payer: Medicare Other | Admitting: Urology

## 2021-08-14 ENCOUNTER — Other Ambulatory Visit: Payer: Self-pay

## 2021-08-14 VITALS — BP 165/72 | HR 68 | Temp 98.6°F

## 2021-08-14 DIAGNOSIS — M6281 Muscle weakness (generalized): Secondary | ICD-10-CM | POA: Diagnosis not present

## 2021-08-14 DIAGNOSIS — R32 Unspecified urinary incontinence: Secondary | ICD-10-CM | POA: Diagnosis not present

## 2021-08-14 DIAGNOSIS — R339 Retention of urine, unspecified: Secondary | ICD-10-CM

## 2021-08-14 DIAGNOSIS — N138 Other obstructive and reflux uropathy: Secondary | ICD-10-CM

## 2021-08-14 DIAGNOSIS — I509 Heart failure, unspecified: Secondary | ICD-10-CM | POA: Diagnosis not present

## 2021-08-14 DIAGNOSIS — N401 Enlarged prostate with lower urinary tract symptoms: Secondary | ICD-10-CM | POA: Diagnosis not present

## 2021-08-14 DIAGNOSIS — R296 Repeated falls: Secondary | ICD-10-CM | POA: Diagnosis not present

## 2021-08-14 LAB — URINALYSIS, ROUTINE W REFLEX MICROSCOPIC
Bilirubin, UA: NEGATIVE
Glucose, UA: NEGATIVE
Ketones, UA: NEGATIVE
Nitrite, UA: NEGATIVE
Protein,UA: NEGATIVE
RBC, UA: NEGATIVE
Specific Gravity, UA: 1.015 (ref 1.005–1.030)
Urobilinogen, Ur: 0.2 mg/dL (ref 0.2–1.0)
pH, UA: 6 (ref 5.0–7.5)

## 2021-08-14 LAB — MICROSCOPIC EXAMINATION
Bacteria, UA: NONE SEEN
RBC, Urine: NONE SEEN /hpf (ref 0–2)
Renal Epithel, UA: NONE SEEN /hpf

## 2021-08-14 NOTE — Progress Notes (Signed)

## 2021-08-14 NOTE — Progress Notes (Signed)
Subjective: 1. Urinary incontinence, unspecified type   2. BPH with urinary obstruction   3. 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 He remains on Myrbetriq and tamsulosin and had finasteride added at his last visit.  His PVR today is down to 67ml from .  He is wearing pull-ups which he changed 2-3x daily.  He will go and void but has urgency and he will leak.  He is able to stand but can't walk now. He has frequency more often that q1hr.  He has nocturnal incontinence that can soak the bed through the diaper.  He has some dysuria.   He has a history of stones and a left nephrolithotomy. He has a history of BPH with BOO  and has had no prostate surgery.        ROS:  ROS  Allergies  Allergen Reactions   Celery Oil     Unknown-listed on MAR provided by facility   Morphine And Related     Unknown-listed on MAR provided by facility   Sulfa Antibiotics     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) (HCC)    Anxiety    Atrial fibrillation (HCC)    Bipolar disorder (HCC)    BPH (benign prostatic hyperplasia)    CHF (congestive heart failure) (HCC)    COPD (chronic obstructive pulmonary disease) (HCC)    CVA (cerebral vascular accident) (HCC)    DM2 (diabetes mellitus, type 2) (HCC)    Gout    HOH (hard of hearing)    Hypertension    Hypothyroidism    Pneumonia    Seizures (HCC)     Past Surgical History:  Procedure Laterality Date   CHOLECYSTECTOMY     LUMBAR LAMINECTOMY     x 2   NEPHROLITHOTOMY Right     Social History   Socioeconomic History   Marital status: Widowed    Spouse name: Not on file   Number of children: 1   Years of education: Not on file   Highest education level: Not on file  Occupational History   Not on file  Tobacco Use   Smoking status: Former    Years: 20.00    Types: Cigarettes    Smokeless tobacco: Former    Types: Chew   Tobacco comments:    smoked from 77yr to 26 yr  Vaping Use   Vaping Use: Never used  Substance and Sexual Activity   Alcohol use: Never   Drug use: Never   Sexual activity: Not on file  Other Topics Concern   Not on file  Social History Narrative   Not on file   Social Determinants of Health   Financial Resource Strain: Not on file  Food Insecurity: Not on file  Transportation Needs: Not on file  Physical Activity: Not on file  Stress: Not on file  Social Connections: Not on file  Intimate Partner Violence: Not on file    Family History  Problem Relation Age of Onset   Diabetes Mother    Thyroid disease Mother    Hypertension Mother    Hyperlipidemia Mother    Heart attack Mother    Cancer Mother    Hypertension Father    Cancer Father     Anti-infectives: Anti-infectives (From admission, onward)    None  Current Outpatient Medications  Medication Sig Dispense Refill   acetaminophen (TYLENOL) 500 MG tablet Take 1,000 mg by mouth in the morning.     albuterol (VENTOLIN HFA) 108 (90 Base) MCG/ACT inhaler Inhale 2 puffs into the lungs in the morning, at noon, and at bedtime. *May use every 4 hours as needed for wheezing     allopurinol (ZYLOPRIM) 100 MG tablet Take 100 mg by mouth daily.     ALPRAZolam (XANAX) 0.25 MG tablet Take 0.25 mg by mouth 2 (two) times daily as needed for anxiety.     apixaban (ELIQUIS) 5 MG TABS tablet Take 5 mg by mouth 2 (two) times daily.     ARIPiprazole (ABILIFY) 2 MG tablet Take 2 mg by mouth in the morning.     BD AUTOSHIELD DUO 30G X 5 MM MISC      bisacodyl (DULCOLAX) 10 MG suppository Place 10 mg rectally daily as needed for moderate constipation.     Cholecalciferol (VITAMIN D3) 1.25 MG (50000 UT) CAPS Take 1 capsule by mouth once a week.     divalproex (DEPAKOTE) 125 MG DR tablet Take 125 mg by mouth every 12 (twelve) hours.     DULoxetine (CYMBALTA) 60 MG capsule Take 60 mg  by mouth daily.     fenofibrate (TRICOR) 145 MG tablet Take 145 mg by mouth daily.     finasteride (PROSCAR) 5 MG tablet Take 1 tablet (5 mg total) by mouth daily. 90 tablet 3   furosemide (LASIX) 40 MG tablet Take 40 mg by mouth in the morning.      gabapentin (NEURONTIN) 100 MG capsule Take 100 mg by mouth in the morning and at bedtime.     hydrocortisone 2.5 % cream Apply 1 application topically daily at 6 (six) AM. Sparingly  to face/neck every evening     insulin aspart (NOVOLOG FLEXPEN) 100 UNIT/ML FlexPen Inject 12-18 Units into the skin 3 (three) times daily before meals. 90-150= 12u, 151-200= 13u, 201-250= 14u, 251-300= 15u, 301-350= 16u, 351-400= 17u, >400= 18u. Notify provider if less than 70 or greater than 400     insulin detemir (LEVEMIR FLEXTOUCH) 100 UNIT/ML FlexPen Inject 70 Units into the skin at bedtime. Prime pen with 2u  prior to each use. Once opened leave at room temp     insulin lispro (HUMALOG) 100 UNIT/ML injection Inject 10 Units into the skin 3 (three) times daily before meals.     isosorbide mononitrate (IMDUR) 30 MG 24 hr tablet Take 30 mg by mouth in the morning.     ketoconazole (NIZORAL) 2 % shampoo Apply 1 application topically 3 (three) times a week. Evern Bio, and Thurs     levETIRAcetam (KEPPRA) 500 MG tablet Take 500 mg by mouth 2 (two) times daily.     levothyroxine (SYNTHROID) 125 MCG tablet Take 125 mcg by mouth daily.     lisinopril (ZESTRIL) 10 MG tablet Take 10 mg by mouth daily.     MAGNESIUM OXIDE PO Take 500 mg by mouth 2 (two) times daily.      metFORMIN (GLUCOPHAGE-XR) 500 MG 24 hr tablet Take 500 mg by mouth every morning.     mirabegron ER (MYRBETRIQ) 50 MG TB24 tablet Take 1 tablet (50 mg total) by mouth daily. 28 tablet 0   nitroGLYCERIN (NITROSTAT) 0.4 MG SL tablet Place 0.4 mg under the tongue every 5 (five) minutes as needed for chest pain.     OXYGEN Inhale 2 L into the lungs continuous. At  Bedtime     rosuvastatin (CRESTOR) 10 MG tablet  Take 10 mg by mouth in the morning.     senna-docusate (SENOKOT-S) 8.6-50 MG tablet Take 2 tablets by mouth 2 (two) times daily.     tamsulosin (FLOMAX) 0.4 MG CAPS capsule Take 0.4 mg by mouth in the morning. *Give after the same meal     No current facility-administered medications for this visit.     Objective: Vital signs in last 24 hours: BP (!) 165/72   Pulse 68   Temp 98.6 F (37 C)   Intake/Output from previous day: No intake/output data recorded. Intake/Output this shift: @IOTHISSHIFT @   Physical Exam  Lab Results:  Results for orders placed or performed in visit on 08/14/21 (from the past 24 hour(s))  Urinalysis, Routine w reflex microscopic     Status: Abnormal   Collection Time: 08/14/21  1:22 PM  Result Value Ref Range   Specific Gravity, UA 1.015 1.005 - 1.030   pH, UA 6.0 5.0 - 7.5   Color, UA Yellow Yellow   Appearance Ur Clear Clear   Leukocytes,UA Trace (A) Negative   Protein,UA Negative Negative/Trace   Glucose, UA Negative Negative   Ketones, UA Negative Negative   RBC, UA Negative Negative   Bilirubin, UA Negative Negative   Urobilinogen, Ur 0.2 0.2 - 1.0 mg/dL   Nitrite, UA Negative Negative   Microscopic Examination See below:    Narrative   Performed at:  [address] - [facility] [address], [place], [place]  [place] Lab Director: [id number], Phone:  [phone]  Microscopic Examination     Status: None   Collection Time: 08/14/21  1:22 PM   Urine  Result Value Ref Range   WBC, UA 0-5 0 - 5 /hpf   RBC None seen 0 - 2 /hpf   Epithelial Cells (non renal) 0-10 0 - 10 /hpf   Renal Epithel, UA None seen None seen /hpf   Bacteria, UA None seen None seen/Few   Narrative   Performed at:  [address] - [facility] [place] [address] [place], [place]  [place] Lab Director: [id number], Phone:  [phone]     BMET No results for input(s): NA, K, CL, CO2, GLUCOSE, BUN, CREATININE, CALCIUM in the last 72  hours. PT/INR No results for input(s): LABPROT, INR in the last 72 hours. ABG No results for input(s): PHART, HCO3 in the last 72 hours.  Invalid input(s): PCO2, PO2  Studies/Results: UA is unremarkable.   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   I will have him continue the Myrbetriq, finasteride and tamsulosin.  He would like to have a condom catheter.   No orders of the defined types were placed in this encounter.    Orders Placed This Encounter  Procedures   Microscopic Examination   Urinalysis, Routine w reflex microscopic   BLADDER SCAN AMB NON-IMAGING     Return in about 6 months (around 02/11/2022) for With a PVR. 04/13/2022    CC: Marland Kitchen MD    Bertram Savin 08/15/2021 13/01/2021 ID: [id number], male   DOB: 12-May-1941, 80 y.o.   MRN: [id number]

## 2021-08-14 NOTE — Progress Notes (Signed)
post void residual =2ml 

## 2021-08-15 DIAGNOSIS — R296 Repeated falls: Secondary | ICD-10-CM | POA: Diagnosis not present

## 2021-08-15 DIAGNOSIS — I509 Heart failure, unspecified: Secondary | ICD-10-CM | POA: Diagnosis not present

## 2021-08-15 DIAGNOSIS — M6281 Muscle weakness (generalized): Secondary | ICD-10-CM | POA: Diagnosis not present

## 2021-08-18 DIAGNOSIS — M6281 Muscle weakness (generalized): Secondary | ICD-10-CM | POA: Diagnosis not present

## 2021-08-18 DIAGNOSIS — R296 Repeated falls: Secondary | ICD-10-CM | POA: Diagnosis not present

## 2021-08-18 DIAGNOSIS — I509 Heart failure, unspecified: Secondary | ICD-10-CM | POA: Diagnosis not present

## 2021-08-18 DIAGNOSIS — Z79899 Other long term (current) drug therapy: Secondary | ICD-10-CM | POA: Diagnosis not present

## 2021-08-19 DIAGNOSIS — M6281 Muscle weakness (generalized): Secondary | ICD-10-CM | POA: Diagnosis not present

## 2021-08-19 DIAGNOSIS — R296 Repeated falls: Secondary | ICD-10-CM | POA: Diagnosis not present

## 2021-08-19 DIAGNOSIS — I509 Heart failure, unspecified: Secondary | ICD-10-CM | POA: Diagnosis not present

## 2021-08-20 DIAGNOSIS — R296 Repeated falls: Secondary | ICD-10-CM | POA: Diagnosis not present

## 2021-08-20 DIAGNOSIS — I509 Heart failure, unspecified: Secondary | ICD-10-CM | POA: Diagnosis not present

## 2021-08-20 DIAGNOSIS — M6281 Muscle weakness (generalized): Secondary | ICD-10-CM | POA: Diagnosis not present

## 2021-08-21 ENCOUNTER — Emergency Department (HOSPITAL_COMMUNITY): Payer: Medicare Other

## 2021-08-21 ENCOUNTER — Other Ambulatory Visit: Payer: Self-pay

## 2021-08-21 ENCOUNTER — Emergency Department (HOSPITAL_COMMUNITY)
Admission: EM | Admit: 2021-08-21 | Discharge: 2021-08-21 | Disposition: A | Discharge status: Home / Self Care | Payer: Medicare Other | Attending: Emergency Medicine | Admitting: Emergency Medicine

## 2021-08-21 DIAGNOSIS — M25562 Pain in left knee: Secondary | ICD-10-CM | POA: Diagnosis not present

## 2021-08-21 DIAGNOSIS — I110 Hypertensive heart disease with heart failure: Secondary | ICD-10-CM | POA: Insufficient documentation

## 2021-08-21 DIAGNOSIS — J449 Chronic obstructive pulmonary disease, unspecified: Secondary | ICD-10-CM | POA: Diagnosis not present

## 2021-08-21 DIAGNOSIS — M25572 Pain in left ankle and joints of left foot: Secondary | ICD-10-CM | POA: Diagnosis not present

## 2021-08-21 DIAGNOSIS — M1612 Unilateral primary osteoarthritis, left hip: Secondary | ICD-10-CM | POA: Diagnosis not present

## 2021-08-21 DIAGNOSIS — W1839XA Other fall on same level, initial encounter: Secondary | ICD-10-CM | POA: Insufficient documentation

## 2021-08-21 DIAGNOSIS — R52 Pain, unspecified: Secondary | ICD-10-CM | POA: Diagnosis not present

## 2021-08-21 DIAGNOSIS — M6281 Muscle weakness (generalized): Secondary | ICD-10-CM | POA: Diagnosis not present

## 2021-08-21 DIAGNOSIS — I517 Cardiomegaly: Secondary | ICD-10-CM | POA: Diagnosis not present

## 2021-08-21 DIAGNOSIS — I509 Heart failure, unspecified: Secondary | ICD-10-CM | POA: Diagnosis not present

## 2021-08-21 DIAGNOSIS — R0902 Hypoxemia: Secondary | ICD-10-CM | POA: Diagnosis not present

## 2021-08-21 DIAGNOSIS — Z7901 Long term (current) use of anticoagulants: Secondary | ICD-10-CM | POA: Diagnosis not present

## 2021-08-21 DIAGNOSIS — R569 Unspecified convulsions: Secondary | ICD-10-CM | POA: Diagnosis not present

## 2021-08-21 DIAGNOSIS — Z743 Need for continuous supervision: Secondary | ICD-10-CM | POA: Diagnosis not present

## 2021-08-21 DIAGNOSIS — Z79899 Other long term (current) drug therapy: Secondary | ICD-10-CM | POA: Insufficient documentation

## 2021-08-21 DIAGNOSIS — Z87891 Personal history of nicotine dependence: Secondary | ICD-10-CM | POA: Insufficient documentation

## 2021-08-21 DIAGNOSIS — Z794 Long term (current) use of insulin: Secondary | ICD-10-CM | POA: Diagnosis not present

## 2021-08-21 DIAGNOSIS — I4891 Unspecified atrial fibrillation: Secondary | ICD-10-CM | POA: Insufficient documentation

## 2021-08-21 DIAGNOSIS — Z043 Encounter for examination and observation following other accident: Secondary | ICD-10-CM | POA: Diagnosis not present

## 2021-08-21 DIAGNOSIS — W19XXXA Unspecified fall, initial encounter: Secondary | ICD-10-CM | POA: Diagnosis not present

## 2021-08-21 DIAGNOSIS — M25522 Pain in left elbow: Secondary | ICD-10-CM | POA: Diagnosis not present

## 2021-08-21 DIAGNOSIS — M542 Cervicalgia: Secondary | ICD-10-CM | POA: Diagnosis not present

## 2021-08-21 DIAGNOSIS — E039 Hypothyroidism, unspecified: Secondary | ICD-10-CM | POA: Diagnosis not present

## 2021-08-21 DIAGNOSIS — R109 Unspecified abdominal pain: Secondary | ICD-10-CM | POA: Diagnosis not present

## 2021-08-21 DIAGNOSIS — S199XXA Unspecified injury of neck, initial encounter: Secondary | ICD-10-CM | POA: Diagnosis not present

## 2021-08-21 DIAGNOSIS — R296 Repeated falls: Secondary | ICD-10-CM | POA: Diagnosis not present

## 2021-08-21 DIAGNOSIS — M7918 Myalgia, other site: Secondary | ICD-10-CM

## 2021-08-21 DIAGNOSIS — M25552 Pain in left hip: Secondary | ICD-10-CM | POA: Diagnosis not present

## 2021-08-21 DIAGNOSIS — S0990XA Unspecified injury of head, initial encounter: Secondary | ICD-10-CM | POA: Diagnosis not present

## 2021-08-21 DIAGNOSIS — M1712 Unilateral primary osteoarthritis, left knee: Secondary | ICD-10-CM | POA: Diagnosis not present

## 2021-08-21 LAB — URINALYSIS, ROUTINE W REFLEX MICROSCOPIC
Bilirubin Urine: NEGATIVE
Glucose, UA: NEGATIVE mg/dL
Hgb urine dipstick: NEGATIVE
Ketones, ur: NEGATIVE mg/dL
Leukocytes,Ua: NEGATIVE
Nitrite: NEGATIVE
Protein, ur: NEGATIVE mg/dL
Specific Gravity, Urine: 1.019 (ref 1.005–1.030)
pH: 5 (ref 5.0–8.0)

## 2021-08-21 MED ORDER — TRAMADOL HCL 50 MG PO TABS
50.0000 mg | ORAL_TABLET | Freq: Once | ORAL | Status: AC
  Administered 2021-08-21: 50 mg via ORAL
  Filled 2021-08-21: qty 1

## 2021-08-21 NOTE — Discharge Instructions (Addendum)
Your xrays and CT imaging are reassuring today with no head, neck or bony injuries identified.  You will probably be sore for the next several days.  You may need to increase the use of your prescribed tramadol.  Plan to follow-up with your primary doctor if your symptoms persist or not improving over the next week.

## 2021-08-21 NOTE — ED Notes (Signed)
Pt sat noted in low 80s while asleep despite good pleth and changing probe location. O2 applied [name] at 2L, O2 improved to 96%

## 2021-08-21 NOTE — ED Notes (Signed)
Transported to CT 

## 2021-08-21 NOTE — ED Triage Notes (Signed)
Pt presents from [facility] for unwitnessed fall on thinners (Eliquis), endorses L hip, L knee, L ankle, L abd, and neck pain along with HA. Frequent falls, baseline general weakness from previous CVA. Larey Seat while trying to transfer himself from commode without help, no LOC. Endorses hitting head.

## 2021-08-21 NOTE — ED Notes (Addendum)
Returned from CT.

## 2021-08-21 NOTE — ED Notes (Signed)
Trialed off O2, now sats 93% RA

## 2021-08-21 NOTE — ED Provider Notes (Signed)
[facility] EMERGENCY DEPARTMENT Provider Note   CSN: [id number] Arrival date & time: 08/21/21  9211     History Chief Complaint  Patient presents with   John Kirk John Kirk is a 80 y.o. male with a history including atrial fibrillation on chronic Eliquis, CHF, COPD, CVA, type 2 diabetes and hypertension, also history of seizure disorder presenting from his local nursing home for evaluation of a fall which occurred just prior to arrival.  This is an unwitnessed fall.  He reports trying to transfer himself from the commode to his wheelchair and slipped and fell landing on his left side.  He endorses pain in his left lateral neck, left hip knee and ankle, and along his left flank.  He denies any new weakness or numbness in his extremities.  He denies hitting his head or having any LOC before during or after this event.   He has had no treatment prior to arrival.  The history is provided by the patient, the EMS personnel and medical records.      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) (HCC)    Anxiety    Atrial fibrillation (HCC)    Bipolar disorder (HCC)    BPH (benign prostatic hyperplasia)    CHF (congestive heart failure) (HCC)    COPD (chronic obstructive pulmonary disease) (HCC)    CVA (cerebral vascular accident) (HCC)    DM2 (diabetes mellitus, type 2) (HCC)    Gout    HOH (hard of hearing)    Hypertension    Hypothyroidism    Pneumonia    Seizures (HCC)     Patient Active Problem List   Diagnosis Date Noted   Type 2 diabetes mellitus without complication, with long-term current use of insulin (HCC) 08/01/2020   Hypothyroidism 08/01/2020    Past Surgical History:  Procedure Laterality Date   CHOLECYSTECTOMY     LUMBAR LAMINECTOMY     x 2   NEPHROLITHOTOMY Right        Family History  Problem Relation Age of Onset   Diabetes Mother    Thyroid disease Mother    Hypertension Mother     Hyperlipidemia Mother    Heart attack Mother    Cancer Mother    Hypertension Father    Cancer Father     Social History   Tobacco Use   Smoking status: Former    Years: 20.00    Types: Cigarettes   Smokeless tobacco: Former    Types: Chew   Tobacco comments:    smoked from 72yr to 26 yr  Vaping Use   Vaping Use: Never used  Substance Use Topics   Alcohol use: Never   Drug use: Never    Home Medications Prior to Admission medications   Medication Sig Start Date End Date Taking? Authorizing Provider  acetaminophen (TYLENOL) 500 MG tablet Take 1,000 mg by mouth in the morning.    [provider]  albuterol (VENTOLIN HFA) 108 (90 Base) MCG/ACT inhaler Inhale 2 puffs into the lungs in the morning, at noon, and at bedtime. *May use every 4 hours as needed for wheezing    [provider]  allopurinol (ZYLOPRIM) 100 MG tablet Take 100 mg by mouth daily.    [provider]  ALPRAZolam Prudy Feeler) 0.25 MG tablet Take 0.25 mg by mouth 2 (two) times daily as needed for anxiety. 06/04/20   [provider]  apixaban (ELIQUIS) 5 MG  TABS tablet Take 5 mg by mouth 2 (two) times daily.    [provider]  ARIPiprazole (ABILIFY) 2 MG tablet Take 2 mg by mouth in the morning.    [provider]  BD AUTOSHIELD DUO 30G X 5 MM MISC  06/19/20   [provider]  bisacodyl (DULCOLAX) 10 MG suppository Place 10 mg rectally daily as needed for moderate constipation.    [provider]  Cholecalciferol (VITAMIN D3) 1.25 MG (50000 UT) CAPS Take 1 capsule by mouth once a week.    [provider]  divalproex (DEPAKOTE) 125 MG DR tablet Take 125 mg by mouth every 12 (twelve) hours.    [provider]  DULoxetine (CYMBALTA) 60 MG capsule Take 60 mg by mouth daily. 06/10/20   [provider]  fenofibrate (TRICOR) 145 MG tablet Take 145 mg by mouth daily. 11/29/20   [provider]  finasteride (PROSCAR) 5 MG tablet  Take 1 tablet (5 mg total) by mouth daily. 05/08/21   Bjorn Pippin, MD  furosemide (LASIX) 40 MG tablet Take 40 mg by mouth in the morning.     [provider]  gabapentin (NEURONTIN) 100 MG capsule Take 100 mg by mouth in the morning and at bedtime.    [provider]  hydrocortisone 2.5 % cream Apply 1 application topically daily at 6 (six) AM. Sparingly  to face/neck every evening    [provider]  insulin aspart (NOVOLOG FLEXPEN) 100 UNIT/ML FlexPen Inject 12-18 Units into the skin 3 (three) times daily before meals. 90-150= 12u, 151-200= 13u, 201-250= 14u, 251-300= 15u, 301-350= 16u, 351-400= 17u, >400= 18u. Notify provider if less than 70 or greater than 400    [provider]  insulin detemir (LEVEMIR FLEXTOUCH) 100 UNIT/ML FlexPen Inject 70 Units into the skin at bedtime. Prime pen with 2u  prior to each use. Once opened leave at room temp    [provider]  insulin lispro (HUMALOG) 100 UNIT/ML injection Inject 10 Units into the skin 3 (three) times daily before meals.    [provider]  isosorbide mononitrate (IMDUR) 30 MG 24 hr tablet Take 30 mg by mouth in the morning.    [provider]  ketoconazole (NIZORAL) 2 % shampoo Apply 1 application topically 3 (three) times a week. Evern Bio, and Thurs 06/11/20   [provider]  levETIRAcetam (KEPPRA) 500 MG tablet Take 500 mg by mouth 2 (two) times daily.    [provider]  levothyroxine (SYNTHROID) 125 MCG tablet Take 125 mcg by mouth daily. 04/11/20   [provider]  lisinopril (ZESTRIL) 10 MG tablet Take 10 mg by mouth daily. 06/09/20   [provider]  MAGNESIUM OXIDE PO Take 500 mg by mouth 2 (two) times daily.     [provider]  metFORMIN (GLUCOPHAGE-XR) 500 MG 24 hr tablet Take 500 mg by mouth every morning. 12/27/20   [provider]  mirabegron ER (MYRBETRIQ) 50 MG TB24 tablet Take 1 tablet (50 mg total) by mouth daily.  06/28/20   Bjorn Pippin, MD  nitroGLYCERIN (NITROSTAT) 0.4 MG SL tablet Place 0.4 mg under the tongue every 5 (five) minutes as needed for chest pain.    [provider]  OXYGEN Inhale 2 L into the lungs continuous. At Bedtime    [provider]  rosuvastatin (CRESTOR) 10 MG tablet Take 10 mg by mouth in the morning.    [provider]  senna-docusate (SENOKOT-S) 8.6-50 MG  tablet Take 2 tablets by mouth 2 (two) times daily.    [provider]  tamsulosin (FLOMAX) 0.4 MG CAPS capsule Take 0.4 mg by mouth in the morning. *Give after the same meal    [provider]    Allergies    Celery oil, Morphine and related, and Sulfa antibiotics  Review of Systems   Review of Systems  Constitutional:  Negative for fever.  HENT:  Negative for congestion and sore throat.   Eyes: Negative.   Respiratory:  Negative for chest tightness and shortness of breath.   Cardiovascular:  Negative for chest pain.  Gastrointestinal:  Positive for abdominal pain. Negative for nausea and vomiting.  Genitourinary: Negative.   Musculoskeletal:  Positive for arthralgias and neck pain. Negative for joint swelling.  Skin: Negative.  Negative for rash and wound.  Neurological:  Negative for dizziness, weakness, light-headedness, numbness and headaches.       Baseline  Psychiatric/Behavioral: Negative.     Physical Exam Updated Vital Signs BP 123/62   Pulse (!) 53   Temp 98.5 F (36.9 C) (Oral)   Resp 15   SpO2 93%   Physical Exam Vitals and nursing note reviewed.  Constitutional:      Appearance: Normal appearance. He is well-developed.  HENT:     Head: Normocephalic and atraumatic.  Eyes:     Extraocular Movements: Extraocular movements intact.     Pupils: Pupils are equal, round, and reactive to light.     Comments: Small focus of conjunctival erythema right lateral eye.  Neck:     Comments: Ttp left lateral cervical spine, no deformity. Cardiovascular:      Rate and Rhythm: Normal rate and regular rhythm.     Heart sounds: Normal heart sounds.  Pulmonary:     Effort: Pulmonary effort is normal.     Breath sounds: Normal breath sounds. No wheezing.  Abdominal:     General: Bowel sounds are normal.     Palpations: Abdomen is soft. There is no mass.     Tenderness: There is abdominal tenderness.       Comments: Ttp long right mid abdomen/flank.  No bruising or induration. No guarding or rebound.  Musculoskeletal:     Left elbow: No deformity. Tenderness present in olecranon process.     Cervical back: Normal range of motion. Tenderness present.     Left hip: Bony tenderness present.     Left foot: Normal range of motion. No swelling or deformity.     Comments: Ttp left lateral thigh, no groin pain. TTP left medial knee.   Skin:    General: Skin is warm and dry.  Neurological:     Mental Status: He is alert.    ED Results / Procedures / Treatments   Labs (all labs ordered are listed, but only abnormal results are displayed) Labs Reviewed  URINALYSIS, ROUTINE W REFLEX MICROSCOPIC    EKG None  Radiology DG Elbow Complete Left  Result Date: 08/21/2021 CLINICAL DATA:  Fall EXAM: LEFT ELBOW - COMPLETE 3+ VIEW COMPARISON:  None. FINDINGS: Vascular calcifications.  No fracture or malalignment. IMPRESSION: No acute osseous abnormality Electronically Signed   By: Jasmine Pang M.D.   On: 08/21/2021 21:03   CT Head Wo Contrast  Result Date: 08/21/2021 CLINICAL DATA:  Fall.  Head injury. EXAM: CT HEAD WITHOUT CONTRAST TECHNIQUE: Contiguous axial images were obtained from the base of the skull through the vertex without intravenous contrast. COMPARISON:  CT head 08/07/2021 FINDINGS:  Brain: Moderate atrophy. Chronic microvascular ischemic change in the white matter and right internal capsule unchanged from the prior study. Negative for acute infarct, hemorrhage, mass Vascular: Negative for hyperdense vessel Skull: Negative Sinuses/Orbits:  Negative Other: Contusion left posterior upper neck. IMPRESSION: No acute intracranial abnormality. Contusion left posterior upper neck. Electronically Signed   By: Marlan Palau M.D.   On: 08/21/2021 20:31   DG Knee Complete 4 Views Left  Result Date: 08/21/2021 CLINICAL DATA:  Fall EXAM: LEFT KNEE - COMPLETE 4+ VIEW COMPARISON:  None. FINDINGS: No fracture or malalignment. Mild patellofemoral degenerative change. Vascular calcifications. No significant effusion IMPRESSION: Mild degenerative change.  No acute osseous abnormality Electronically Signed   By: Jasmine Pang M.D.   On: 08/21/2021 21:04   DG Foot Complete Left  Result Date: 08/21/2021 CLINICAL DATA:  Fall EXAM: LEFT FOOT - COMPLETE 3+ VIEW COMPARISON:  None. FINDINGS: No fracture or malalignment. Vascular calcifications. Degenerative changes at the first TMT joint and MTP joints. IMPRESSION: No acute osseous abnormality. Electronically Signed   By: Jasmine Pang M.D.   On: 08/21/2021 21:05   DG Hip Unilat W or Wo Pelvis 2-3 Views Left  Result Date: 08/21/2021 CLINICAL DATA:  Fall EXAM: DG HIP (WITH OR WITHOUT PELVIS) 2-3V LEFT COMPARISON:  08/07/2021 FINDINGS: SI joints are non widened. Pubic symphysis and rami are intact. Vascular calcifications. No fracture or malalignment. Mild degenerative changes of both hips. IMPRESSION: Moderate degenerative changes.  No acute osseous abnormality Electronically Signed   By: Jasmine Pang M.D.   On: 08/21/2021 21:00    Procedures Procedures   Medications Ordered in ED Medications  traMADol (ULTRAM) tablet 50 mg (50 mg Oral Given 08/21/21 2204)    ED Course  I have reviewed the triage vital signs and the nursing notes.  Pertinent labs & imaging results that were available during my care of the patient were reviewed by me and considered in my medical decision making (see chart for details).    MDM Rules/Calculators/A&P                           Imaging reviewed and negative for  acute fracture, bony dislocations or intracranial injury.  Will be discharged back to his nursing facility.  He did have 2 episodes of transient hypoxia while here.  Review of his MAR indicates that he chronically uses oxygen nightly and as needed.  He was placed on 2 L nasal cannula here and had no additional hypoxia.  He has no wheezing or rhonchi, no rales on exam. Final Clinical Impression(s) / ED Diagnoses Final diagnoses:  Fall, initial encounter  Musculoskeletal pain    Rx / DC Orders ED Discharge Orders     None        Victoriano Lain 08/21/21 2258    Eber Hong, MD 08/22/21 1436

## 2021-08-21 NOTE — ED Notes (Signed)
Called c-com for transport back to [facility]. John Kirk

## 2021-08-21 NOTE — ED Notes (Signed)
C-collar in place

## 2021-08-22 DIAGNOSIS — Z7401 Bed confinement status: Secondary | ICD-10-CM | POA: Diagnosis not present

## 2021-08-22 DIAGNOSIS — R6889 Other general symptoms and signs: Secondary | ICD-10-CM | POA: Diagnosis not present

## 2021-08-22 DIAGNOSIS — Z79899 Other long term (current) drug therapy: Secondary | ICD-10-CM | POA: Diagnosis not present

## 2021-08-22 DIAGNOSIS — I509 Heart failure, unspecified: Secondary | ICD-10-CM | POA: Diagnosis not present

## 2021-08-22 DIAGNOSIS — R279 Unspecified lack of coordination: Secondary | ICD-10-CM | POA: Diagnosis not present

## 2021-08-22 DIAGNOSIS — E785 Hyperlipidemia, unspecified: Secondary | ICD-10-CM | POA: Diagnosis not present

## 2021-08-22 DIAGNOSIS — Z043 Encounter for examination and observation following other accident: Secondary | ICD-10-CM | POA: Diagnosis not present

## 2021-08-22 DIAGNOSIS — D696 Thrombocytopenia, unspecified: Secondary | ICD-10-CM | POA: Diagnosis not present

## 2021-08-22 DIAGNOSIS — M6281 Muscle weakness (generalized): Secondary | ICD-10-CM | POA: Diagnosis not present

## 2021-08-22 DIAGNOSIS — R296 Repeated falls: Secondary | ICD-10-CM | POA: Diagnosis not present

## 2021-08-22 DIAGNOSIS — R404 Transient alteration of awareness: Secondary | ICD-10-CM | POA: Diagnosis not present

## 2021-08-22 DIAGNOSIS — Z743 Need for continuous supervision: Secondary | ICD-10-CM | POA: Diagnosis not present

## 2021-08-25 DIAGNOSIS — I509 Heart failure, unspecified: Secondary | ICD-10-CM | POA: Diagnosis not present

## 2021-08-25 DIAGNOSIS — M6281 Muscle weakness (generalized): Secondary | ICD-10-CM | POA: Diagnosis not present

## 2021-08-25 DIAGNOSIS — R296 Repeated falls: Secondary | ICD-10-CM | POA: Diagnosis not present

## 2021-08-26 DIAGNOSIS — Z043 Encounter for examination and observation following other accident: Secondary | ICD-10-CM | POA: Diagnosis not present

## 2021-08-26 DIAGNOSIS — I509 Heart failure, unspecified: Secondary | ICD-10-CM | POA: Diagnosis not present

## 2021-08-26 DIAGNOSIS — E119 Type 2 diabetes mellitus without complications: Secondary | ICD-10-CM | POA: Diagnosis not present

## 2021-08-26 DIAGNOSIS — M6281 Muscle weakness (generalized): Secondary | ICD-10-CM | POA: Diagnosis not present

## 2021-08-26 DIAGNOSIS — R296 Repeated falls: Secondary | ICD-10-CM | POA: Diagnosis not present

## 2021-08-27 DIAGNOSIS — R296 Repeated falls: Secondary | ICD-10-CM | POA: Diagnosis not present

## 2021-08-27 DIAGNOSIS — M6281 Muscle weakness (generalized): Secondary | ICD-10-CM | POA: Diagnosis not present

## 2021-08-27 DIAGNOSIS — I509 Heart failure, unspecified: Secondary | ICD-10-CM | POA: Diagnosis not present

## 2021-08-28 DIAGNOSIS — M6281 Muscle weakness (generalized): Secondary | ICD-10-CM | POA: Diagnosis not present

## 2021-08-28 DIAGNOSIS — I509 Heart failure, unspecified: Secondary | ICD-10-CM | POA: Diagnosis not present

## 2021-08-28 DIAGNOSIS — R296 Repeated falls: Secondary | ICD-10-CM | POA: Diagnosis not present

## 2021-08-29 DIAGNOSIS — M6281 Muscle weakness (generalized): Secondary | ICD-10-CM | POA: Diagnosis not present

## 2021-08-29 DIAGNOSIS — I509 Heart failure, unspecified: Secondary | ICD-10-CM | POA: Diagnosis not present

## 2021-08-29 DIAGNOSIS — R296 Repeated falls: Secondary | ICD-10-CM | POA: Diagnosis not present

## 2021-08-30 DIAGNOSIS — R296 Repeated falls: Secondary | ICD-10-CM | POA: Diagnosis not present

## 2021-08-30 DIAGNOSIS — M6281 Muscle weakness (generalized): Secondary | ICD-10-CM | POA: Diagnosis not present

## 2021-08-30 DIAGNOSIS — I509 Heart failure, unspecified: Secondary | ICD-10-CM | POA: Diagnosis not present

## 2021-08-31 DIAGNOSIS — M6281 Muscle weakness (generalized): Secondary | ICD-10-CM | POA: Diagnosis not present

## 2021-08-31 DIAGNOSIS — I509 Heart failure, unspecified: Secondary | ICD-10-CM | POA: Diagnosis not present

## 2021-08-31 DIAGNOSIS — R296 Repeated falls: Secondary | ICD-10-CM | POA: Diagnosis not present

## 2021-09-01 DIAGNOSIS — I509 Heart failure, unspecified: Secondary | ICD-10-CM | POA: Diagnosis not present

## 2021-09-01 DIAGNOSIS — R296 Repeated falls: Secondary | ICD-10-CM | POA: Diagnosis not present

## 2021-09-01 DIAGNOSIS — M6281 Muscle weakness (generalized): Secondary | ICD-10-CM | POA: Diagnosis not present

## 2021-09-02 DIAGNOSIS — M6281 Muscle weakness (generalized): Secondary | ICD-10-CM | POA: Diagnosis not present

## 2021-09-02 DIAGNOSIS — I509 Heart failure, unspecified: Secondary | ICD-10-CM | POA: Diagnosis not present

## 2021-09-02 DIAGNOSIS — R296 Repeated falls: Secondary | ICD-10-CM | POA: Diagnosis not present

## 2021-09-03 DIAGNOSIS — R296 Repeated falls: Secondary | ICD-10-CM | POA: Diagnosis not present

## 2021-09-03 DIAGNOSIS — M6281 Muscle weakness (generalized): Secondary | ICD-10-CM | POA: Diagnosis not present

## 2021-09-03 DIAGNOSIS — I509 Heart failure, unspecified: Secondary | ICD-10-CM | POA: Diagnosis not present

## 2021-09-04 DIAGNOSIS — M6281 Muscle weakness (generalized): Secondary | ICD-10-CM | POA: Diagnosis not present

## 2021-09-04 DIAGNOSIS — I509 Heart failure, unspecified: Secondary | ICD-10-CM | POA: Diagnosis not present

## 2021-09-04 DIAGNOSIS — R296 Repeated falls: Secondary | ICD-10-CM | POA: Diagnosis not present

## 2021-09-08 DIAGNOSIS — I509 Heart failure, unspecified: Secondary | ICD-10-CM | POA: Diagnosis not present

## 2021-09-08 DIAGNOSIS — R296 Repeated falls: Secondary | ICD-10-CM | POA: Diagnosis not present

## 2021-09-08 DIAGNOSIS — M6281 Muscle weakness (generalized): Secondary | ICD-10-CM | POA: Diagnosis not present

## 2021-09-09 ENCOUNTER — Encounter: Payer: Self-pay | Admitting: Nurse Practitioner

## 2021-09-09 ENCOUNTER — Ambulatory Visit (INDEPENDENT_AMBULATORY_CARE_PROVIDER_SITE_OTHER): Payer: Medicare Other | Admitting: Nurse Practitioner

## 2021-09-09 ENCOUNTER — Ambulatory Visit: Payer: Medicare Other | Admitting: Nurse Practitioner

## 2021-09-09 ENCOUNTER — Other Ambulatory Visit: Payer: Self-pay

## 2021-09-09 VITALS — BP 134/80 | HR 52 | Ht 69.0 in

## 2021-09-09 DIAGNOSIS — Z794 Long term (current) use of insulin: Secondary | ICD-10-CM

## 2021-09-09 DIAGNOSIS — M6281 Muscle weakness (generalized): Secondary | ICD-10-CM | POA: Diagnosis not present

## 2021-09-09 DIAGNOSIS — E119 Type 2 diabetes mellitus without complications: Secondary | ICD-10-CM

## 2021-09-09 DIAGNOSIS — E039 Hypothyroidism, unspecified: Secondary | ICD-10-CM

## 2021-09-09 DIAGNOSIS — R296 Repeated falls: Secondary | ICD-10-CM | POA: Diagnosis not present

## 2021-09-09 DIAGNOSIS — I509 Heart failure, unspecified: Secondary | ICD-10-CM | POA: Diagnosis not present

## 2021-09-09 LAB — POCT GLYCOSYLATED HEMOGLOBIN (HGB A1C): Hemoglobin A1C: 8 % — AB (ref 4.0–5.6)

## 2021-09-09 NOTE — Progress Notes (Signed)
Endocrinology Follow Up Note       09/09/2021, 11:31 AM   Subjective:    Patient ID: John Kirk, male    DOB: Mar 17, 1941.  CORDIE BUENING is being seen in follow up after being seen in consultation for management of currently uncontrolled symptomatic diabetes requested by  Beatrix Fetters, MD.   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) (HCC)    Anxiety    Atrial fibrillation (HCC)    Bipolar disorder (HCC)    BPH (benign prostatic hyperplasia)    CHF (congestive heart failure) (HCC)    COPD (chronic obstructive pulmonary disease) (HCC)    CVA (cerebral vascular accident) (HCC)    DM2 (diabetes mellitus, type 2) (HCC)    Gout    HOH (hard of hearing)    Hypertension    Hypothyroidism    Pneumonia    Seizures (HCC)     Past Surgical History:  Procedure Laterality Date   CHOLECYSTECTOMY     LUMBAR LAMINECTOMY     x 2   NEPHROLITHOTOMY Right     Social History   Socioeconomic History   Marital status: Widowed    Spouse name: Not on file   Number of children: 1   Years of education: Not on file   Highest education level: Not on file  Occupational History   Not on file  Tobacco Use   Smoking status: Former    Years: 20.00    Types: Cigarettes   Smokeless tobacco: Former    Types: Chew   Tobacco comments:    smoked from 62yr to 26 yr  Vaping Use   Vaping Use: Never used  Substance and Sexual Activity   Alcohol use: Never   Drug use: Never   Sexual activity: Not on file  Other Topics Concern   Not on file  Social History Narrative   Not on file   Social Determinants of Health   Financial Resource Strain: Not on file  Food Insecurity: Not on file  Transportation Needs: Not on file  Physical Activity: Not on file  Stress: Not on file  Social Connections: Not on file    Family History  Problem Relation Age of Onset    Diabetes Mother    Thyroid disease Mother    Hypertension Mother    Hyperlipidemia Mother    Heart attack Mother    Cancer Mother    Hypertension Father    Cancer Father     Outpatient Encounter Medications as of 09/09/2021  Medication Sig   allopurinol (ZYLOPRIM) 100 MG tablet Take 100 mg by mouth daily.   apixaban (ELIQUIS) 5 MG TABS tablet Take 5 mg by mouth 2 (two) times daily.   bisacodyl (DULCOLAX) 10 MG suppository Place 10 mg rectally daily as needed for moderate constipation.   cloNIDine (CATAPRES) 0.1 MG tablet Take 0.1 mg by mouth daily as needed.   divalproex (DEPAKOTE) 125 MG DR tablet Take 125 mg by mouth every 12 (twelve) hours.   DULoxetine (CYMBALTA) 60 MG capsule Take 60 mg by mouth daily.   finasteride (PROSCAR) 5 MG tablet Take 1 tablet (5 mg  total) by mouth daily.   furosemide (LASIX) 40 MG tablet Take 40 mg by mouth in the morning.    gabapentin (NEURONTIN) 100 MG capsule Take 100 mg by mouth in the morning and at bedtime.   hydrocortisone 2.5 % cream Apply 1 application topically daily at 6 (six) AM. Sparingly  to face/neck every evening   isosorbide mononitrate (IMDUR) 30 MG 24 hr tablet Take 30 mg by mouth in the morning.   levETIRAcetam (KEPPRA) 500 MG tablet Take 500 mg by mouth 2 (two) times daily.   levothyroxine (SYNTHROID) 125 MCG tablet Take 125 mcg by mouth daily.   lisinopril (ZESTRIL) 10 MG tablet Take 10 mg by mouth daily.   MAGNESIUM OXIDE PO Take 500 mg by mouth 2 (two) times daily.    metFORMIN (GLUCOPHAGE) 500 MG tablet Take 500 mg by mouth 2 (two) times daily with a meal.   mirabegron ER (MYRBETRIQ) 50 MG TB24 tablet Take 1 tablet (50 mg total) by mouth daily.   rosuvastatin (CRESTOR) 10 MG tablet Take 10 mg by mouth in the morning.   senna-docusate (SENOKOT-S) 8.6-50 MG tablet Take 2 tablets by mouth 2 (two) times daily.   tamsulosin (FLOMAX) 0.4 MG CAPS capsule Take 0.4 mg by mouth in the morning. *Give after the same meal    [DISCONTINUED] metFORMIN (GLUCOPHAGE-XR) 500 MG 24 hr tablet Take 500 mg by mouth every morning.   acetaminophen (TYLENOL) 500 MG tablet Take 650 mg by mouth in the morning.   albuterol (VENTOLIN HFA) 108 (90 Base) MCG/ACT inhaler Inhale 2 puffs into the lungs in the morning, at noon, and at bedtime. *May use every 4 hours as needed for wheezing   ALPRAZolam (XANAX) 0.25 MG tablet Take 0.25 mg by mouth 2 (two) times daily as needed for anxiety.   ARIPiprazole (ABILIFY) 2 MG tablet Take 2 mg by mouth in the morning.   BD AUTOSHIELD DUO 30G X 5 MM MISC    Cholecalciferol (VITAMIN D3) 1.25 MG (50000 UT) CAPS Take 1 capsule by mouth once a week.   fenofibrate (TRICOR) 145 MG tablet Take 145 mg by mouth daily.   insulin aspart (NOVOLOG FLEXPEN) 100 UNIT/ML FlexPen Inject 12-18 Units into the skin 3 (three) times daily before meals. 90-150= 12u, 151-200= 13u, 201-250= 14u, 251-300= 15u, 301-350= 16u, 351-400= 17u, >400= 18u. Notify provider if less than 70 or greater than 400   insulin detemir (LEVEMIR FLEXTOUCH) 100 UNIT/ML FlexPen Inject 70 Units into the skin at bedtime. Prime pen with 2u  prior to each use. Once opened leave at room temp   insulin lispro (HUMALOG) 100 UNIT/ML injection Inject 10 Units into the skin 3 (three) times daily before meals.   ketoconazole (NIZORAL) 2 % shampoo Apply 1 application topically 3 (three) times a week. Evern Bio, and Thurs   nitroGLYCERIN (NITROSTAT) 0.4 MG SL tablet Place 0.4 mg under the tongue every 5 (five) minutes as needed for chest pain.   OXYGEN Inhale 2 L into the lungs continuous. At Bedtime   No facility-administered encounter medications on file as of 09/09/2021.    ALLERGIES: Allergies  Allergen Reactions   Celery Oil     Unknown-listed on MAR provided by facility   Morphine And Related     Unknown-listed on MAR provided by facility   Sulfa Antibiotics     VACCINATION STATUS:  There is no immunization history on file for this  patient.  Diabetes He presents for his follow-up diabetic visit. He has  type 2 diabetes mellitus. Onset time: He was diagnosed at approximate age of 44 years. His disease course has been improving. There are no hypoglycemic associated symptoms. Pertinent negatives for hypoglycemia include no confusion, headaches, nervousness/anxiousness, pallor, seizures or tremors. Associated symptoms include blurred vision, fatigue and foot paresthesias. Pertinent negatives for diabetes include no chest pain, no polydipsia, no polyphagia, no polyuria, no weakness and no weight loss. There are no hypoglycemic complications. Symptoms are stable. Diabetic complications include a CVA, heart disease, nephropathy and peripheral neuropathy. Risk factors for coronary artery disease include male sex, obesity, sedentary lifestyle, tobacco exposure, family history, diabetes mellitus, hypertension and dyslipidemia. Current diabetic treatment includes intensive insulin program and oral agent (monotherapy). He is compliant with treatment all of the time (with assistance from assisted living facility staff). His weight is stable. He is following a generally unhealthy diet. When asked about meal planning, he reported none. He has not had a previous visit with a dietitian. He never participates in exercise. His home blood glucose trend is fluctuating dramatically. His breakfast blood glucose range is generally >200 mg/dl. His lunch blood glucose range is generally 140-180 mg/dl. His dinner blood glucose range is generally 130-140 mg/dl. His bedtime blood glucose range is generally >200 mg/dl. His overall blood glucose range is >200 mg/dl. (He presents today, accompanied by a care attendant from the nursing home, with his logs (had to call facility and have them faxed) showing significantly above target fasting and at goal postprandial glycemic profile.  I suspect the staff may be checking his glucose after he has already started eating his meal  which may be contributing to such highs early in the morning.  He does admit to some nighttime snacking as well.  There are only rare occasions of mild hypoglycemia documented.  It still does not appear that the NH is performing the SSI insulin as directed at last visit (looks as though they are doing an even 16 units at meals).  ) An ACE inhibitor/angiotensin II receptor blocker is being taken. He sees a podiatrist (Podiatrist comes to ALF periodically).Eye exam is current.  Thyroid Problem Presents for follow-up visit. Onset time: Patient does not recall the circumstance of his diagnosis with hypothyroidism. Symptoms include fatigue. Patient reports no anxiety, cold intolerance, constipation, depressed mood, diarrhea, heat intolerance, palpitations, tremors, weight gain or weight loss. The symptoms have been stable. Past treatments include levothyroxine (Is currently on levothyroxine 125 mg p.o. daily.). The following procedures have not been performed: radioiodine uptake scan and thyroidectomy. His past medical history is significant for diabetes, heart failure and obesity.  Hypertension This is a chronic problem. The current episode started more than 1 year ago. The problem is unchanged. The problem is controlled. Associated symptoms include blurred vision. Pertinent negatives include no chest pain, headaches, neck pain, palpitations or shortness of breath. Agents associated with hypertension include thyroid hormones. Risk factors for coronary artery disease include diabetes mellitus, male gender, obesity, sedentary lifestyle, smoking/tobacco exposure, family history and dyslipidemia. Past treatments include ACE inhibitors and diuretics. The current treatment provides mild improvement. There are no compliance problems.  Hypertensive end-organ damage includes kidney disease, CAD/MI, CVA and heart failure. Identifiable causes of hypertension include chronic renal disease and a thyroid problem.   Review of  systems  Constitutional: + Minimally fluctuating body weight,  current Body mass index is 34.7 kg/m. , + fatigue, no subjective hyperthermia, no subjective hypothermia Eyes: + blurry vision, no xerophthalmia ENT: no sore throat, no nodules palpated in throat,  no dysphagia/odynophagia, no hoarseness Cardiovascular: no chest pain, no shortness of breath, no palpitations Respiratory: no cough, no shortness of breath Gastrointestinal: no nausea/vomiting/diarrhea Musculoskeletal: no muscle/joint aches, wheelchair bound due to previous CVA, disequilibrium, deconditioning Skin: no rashes, + hyperemia to BLE Neurological: no tremors, no numbness, no tingling, no dizziness Psychiatric: no depression, no anxiety    Objective:    BP 134/80   Pulse (!) 52   Ht 5\' 9"  (1.753 m)   BMI 34.70 kg/m   Wt Readings from Last 3 Encounters:  08/07/21 235 lb (106.6 kg)  01/06/21 247 lb (112 kg)  12/31/20 249 lb (112.9 kg)    BP Readings from Last 3 Encounters:  09/09/21 134/80  08/21/21 134/61  08/14/21 (!) 165/72    Physical Exam- Limited  Constitutional:  Body mass index is 34.7 kg/m. , not in acute distress, normal state of mind, his shirt is dirty from breakfast and his beard and lips have food stuck on them, overall hygiene is poor Eyes:  EOMI, no exophthalmos Neck: Supple Cardiovascular: RRR, no murmurs, rubs, or gallops, no edema Respiratory: Adequate breathing efforts, no crackles, rales, rhonchi, or wheezing Musculoskeletal: no gross deformities, WC bound due to previous CVA Skin:  no rashes, + hyperemia to BLE Neurological: no tremor with outstretched hands   CMP ( most recent) CMP     Component Value Date/Time   NA 139 05/27/2021 0000   K 4.2 05/27/2021 0000   CL 98 (A) 05/27/2021 0000   CO2 25 (A) 05/27/2021 0000   GLUCOSE 115 (H) 12/31/2020 0846   BUN 10 05/27/2021 0000   CREATININE 0.8 05/27/2021 0000   CREATININE 0.97 12/31/2020 0846   CALCIUM 9.2 05/27/2021 0000    PROT 6.9 12/31/2020 0846   ALBUMIN 3.9 05/27/2021 0000   AST 10 (A) 05/27/2021 0000   ALT 9 (A) 05/27/2021 0000   ALKPHOS 28 (L) 12/31/2020 0846   BILITOT 0.6 12/31/2020 0846   GFRNONAA >60 12/31/2020 0846   GFRAA 77 11/13/2020 0000     Diabetic Labs (most recent): Lab Results  Component Value Date   HGBA1C 8.2 05/27/2021   HGBA1C 8.7 (A) 03/03/2021   HGBA1C 9.5 (A) 12/04/2020     Lab Results  Component Value Date   TSH 2.74 11/13/2020   TSH 1.70 03/20/2020      Assessment & Plan:   1) Type 2 diabetes mellitus without complication, with long-term current use   - NATHANEAL SOMMERS has currently uncontrolled symptomatic type 2 DM since 80 years of age.   Recent labs reviewed.  He presents today, accompanied by a care attendant from the nursing home, with his logs (had to call facility and have them faxed) showing significantly above target fasting and at goal postprandial glycemic profile.  I suspect the staff may be checking his glucose after he has already started eating his meal which may be contributing to such highs early in the morning.  He does admit to some nighttime snacking as well.  There are only rare occasions of mild hypoglycemia documented.  It still does not appear that the NH is performing the SSI insulin as directed at last visit (looks as though they are doing an even 16 units at meals).    - I had a long discussion with him about the progressive nature of diabetes and the pathology behind its complications. -his diabetes is complicated by CVA, obesity/sedentary life and he remains at a high risk for more acute and chronic complications which  include CAD, CVA, CKD, retinopathy, and neuropathy. These are all discussed in detail with him.  - Nutritional counseling repeated at each appointment due to patients tendency to fall back in to old habits.  - The patient admits there is a room for improvement in their diet and drink choices. -  Suggestion is made for the  patient to avoid simple carbohydrates from their diet including Cakes, Sweet Desserts / Pastries, Ice Cream, Soda (diet and regular), Sweet Tea, Candies, Chips, Cookies, Sweet Pastries, Store Bought Juices, Alcohol in Excess of 1-2 drinks a day, Artificial Sweeteners, Coffee Creamer, and "Sugar-free" Products. This will help patient to have stable blood glucose profile and potentially avoid unintended weight gain.   - I encouraged the patient to switch to unprocessed or minimally processed complex starch and increased protein intake (animal or plant source), fruits, and vegetables.   - Patient is advised to stick to a routine mealtimes to eat 3 meals a day and avoid unnecessary snacks (to snack only to correct hypoglycemia).  - I have approached him with the following individualized plan to manage  his diabetes and patient agrees:   -In light of his chronic glycemic burden, he will need intensive treatment with basal/bolus insulin in order for him to achieve and maintain control of diabetes to target.  -He is advised to continue Levemir 70 units SQ nightly.  The nursing home had not been doing the sliding scale insulin in addition to his meal coverage.  They are advised to restart Humalog 12-18 units TID with meals if glucose is above 90 and he is eating (Specific instructions on how to titrate insulin dosage based on glucose readings given to patient in writing). He is also advised to continue Metformin 500 mg po twice daily with meals- preferred by the nursing home over the ER form.   -He is encouraged to continue monitoring glucose 4 times daily, before meals and before bed, and call the clinic if he has readings less than 70 or greater than 300 for 3 tests in a row.  -I also asked that the nursing home send the patient with his glucose records, as we had to wait today for readings to be faxed.  - Specific targets for  A1c;  LDL, HDL,  and Triglycerides were discussed with the patient.  2) Blood  Pressure /Hypertension:   his blood pressure is controlled to target.   he is advised to continue his current medications including Lisinopril 10 mg p.o. daily with breakfast and Lasix 40 mg po daily.  3) Lipids/Hyperlipidemia:  His recent lipid panel from 11/04/20 shows controlled LDL of 75 and slightly elevated triglycerides of 156.  He is advised to continue Crestor 10 mg po daily at bedtime.  Side effects and precautions discussed with him.   Will recheck lipid panel prior to next visit.  4)  Weight/Diet:  His Body mass index is 34.7 kg/m.  -   clearly complicating his diabetes care.   he is  a candidate for weight loss. I discussed with him the fact that loss of 5 - 10% of his  current body weight will have the most impact on his diabetes management.  Exercise, and detailed carbohydrates information provided  -  detailed on discharge instructions.  5) Hypothyroidism: -There are no recent TFTs to review.  He is advised to continue Levothyroxine 125 mcg po daily before breakfast.  Will recheck TFTs prior to next visit.   - We discussed about the correct intake of  his thyroid hormone, on empty stomach at fasting, with water, separated by at least 30 minutes from breakfast and other medications,  and separated by more than 4 hours from calcium, iron, multivitamins, acid reflux medications (PPIs). -Patient is made aware of the fact that thyroid hormone replacement is needed for life, dose to be adjusted by periodic monitoring of thyroid function tests.  6) Chronic Care/Health Maintenance: -he is on ACEI medications and is encouraged to initiate and continue to follow up with Ophthalmology, Dentist, Podiatrist at least yearly or according to recommendations, and advised to stay away from smoking. I have recommended yearly flu vaccine and pneumonia vaccine at least every 5 years; moderate intensity exercise for up to 150 minutes weekly; and  sleep for at least 7 hours a day.  - he is advised to  maintain close follow up with Kotturi, Zadie Rhine, MD for primary care needs, as well as his other providers for optimal and coordinated care.       I spent 40 minutes in the care of the patient today including review of labs from CMP, Lipids, Thyroid Function, Hematology (current and previous including abstractions from other facilities); face-to-face time discussing  his blood glucose readings/logs, discussing hypoglycemia and hyperglycemia episodes and symptoms, medications doses, his options of short and long term treatment based on the latest standards of care / guidelines;  discussion about incorporating lifestyle medicine;  and documenting the encounter.    Please refer to Patient Instructions for Blood Glucose Monitoring and Insulin/Medications Dosing Guide"  in media tab for additional information. Please  also refer to " Patient Self Inventory" in the Media  tab for reviewed elements of pertinent patient history.  Hinton Dyer participated in the discussions, expressed understanding, and voiced agreement with the above plans.  All questions were answered to his satisfaction. he is encouraged to contact clinic should he have any questions or concerns prior to his return visit.  Follow up plan: - Return in about 3 months (around 12/09/2021) for Diabetes F/U with A1c in office, Thyroid follow up, Previsit labs, Bring meter and logs.  Ronny Bacon, [facility] [address] [place], [place] [address] Phone: [phone] Fax: [phone]  09/09/2021, 11:31 AM

## 2021-09-09 NOTE — Patient Instructions (Incomplete)

## 2021-09-10 DIAGNOSIS — I509 Heart failure, unspecified: Secondary | ICD-10-CM | POA: Diagnosis not present

## 2021-09-10 DIAGNOSIS — R296 Repeated falls: Secondary | ICD-10-CM | POA: Diagnosis not present

## 2021-09-10 DIAGNOSIS — M6281 Muscle weakness (generalized): Secondary | ICD-10-CM | POA: Diagnosis not present

## 2021-09-11 DIAGNOSIS — I509 Heart failure, unspecified: Secondary | ICD-10-CM | POA: Diagnosis not present

## 2021-09-11 DIAGNOSIS — Z79899 Other long term (current) drug therapy: Secondary | ICD-10-CM | POA: Diagnosis not present

## 2021-09-11 DIAGNOSIS — M6281 Muscle weakness (generalized): Secondary | ICD-10-CM | POA: Diagnosis not present

## 2021-09-12 DIAGNOSIS — I509 Heart failure, unspecified: Secondary | ICD-10-CM | POA: Diagnosis not present

## 2021-09-12 DIAGNOSIS — M6281 Muscle weakness (generalized): Secondary | ICD-10-CM | POA: Diagnosis not present

## 2021-10-13 DIAGNOSIS — Z8673 Personal history of transient ischemic attack (TIA), and cerebral infarction without residual deficits: Secondary | ICD-10-CM | POA: Diagnosis not present

## 2021-10-14 DIAGNOSIS — Z8673 Personal history of transient ischemic attack (TIA), and cerebral infarction without residual deficits: Secondary | ICD-10-CM | POA: Diagnosis not present

## 2021-10-14 DIAGNOSIS — E559 Vitamin D deficiency, unspecified: Secondary | ICD-10-CM | POA: Diagnosis not present

## 2021-10-14 DIAGNOSIS — J449 Chronic obstructive pulmonary disease, unspecified: Secondary | ICD-10-CM | POA: Diagnosis not present

## 2021-10-14 DIAGNOSIS — D649 Anemia, unspecified: Secondary | ICD-10-CM | POA: Diagnosis not present

## 2021-10-14 DIAGNOSIS — E785 Hyperlipidemia, unspecified: Secondary | ICD-10-CM | POA: Diagnosis not present

## 2021-10-14 LAB — LIPID PANEL
Cholesterol: 116 (ref 0–200)
HDL: 34 — AB (ref 35–70)
LDL Cholesterol: 55
Triglycerides: 154 (ref 40–160)

## 2021-10-14 LAB — VITAMIN D 25 HYDROXY (VIT D DEFICIENCY, FRACTURES): Vit D, 25-Hydroxy: 71

## 2021-10-14 LAB — CBC AND DIFFERENTIAL
HCT: 42 (ref 41–53)
Hemoglobin: 13.7 (ref 13.5–17.5)
Neutrophils Absolute: 2
Platelets: 97 10*3/uL — AB (ref 150–400)
WBC: 4.6

## 2021-10-14 LAB — CBC: RBC: 4.51 (ref 3.87–5.11)

## 2021-10-15 DIAGNOSIS — J449 Chronic obstructive pulmonary disease, unspecified: Secondary | ICD-10-CM | POA: Diagnosis not present

## 2021-10-15 DIAGNOSIS — Z8673 Personal history of transient ischemic attack (TIA), and cerebral infarction without residual deficits: Secondary | ICD-10-CM | POA: Diagnosis not present

## 2021-10-15 DIAGNOSIS — E119 Type 2 diabetes mellitus without complications: Secondary | ICD-10-CM | POA: Diagnosis not present

## 2021-10-15 DIAGNOSIS — I4891 Unspecified atrial fibrillation: Secondary | ICD-10-CM | POA: Diagnosis not present

## 2021-10-15 DIAGNOSIS — I10 Essential (primary) hypertension: Secondary | ICD-10-CM | POA: Diagnosis not present

## 2021-10-16 DIAGNOSIS — Z8673 Personal history of transient ischemic attack (TIA), and cerebral infarction without residual deficits: Secondary | ICD-10-CM | POA: Diagnosis not present

## 2021-10-17 DIAGNOSIS — Z8673 Personal history of transient ischemic attack (TIA), and cerebral infarction without residual deficits: Secondary | ICD-10-CM | POA: Diagnosis not present

## 2021-10-20 DIAGNOSIS — Z8673 Personal history of transient ischemic attack (TIA), and cerebral infarction without residual deficits: Secondary | ICD-10-CM | POA: Diagnosis not present

## 2021-10-21 DIAGNOSIS — Z8673 Personal history of transient ischemic attack (TIA), and cerebral infarction without residual deficits: Secondary | ICD-10-CM | POA: Diagnosis not present

## 2021-10-30 DIAGNOSIS — E1142 Type 2 diabetes mellitus with diabetic polyneuropathy: Secondary | ICD-10-CM | POA: Diagnosis not present

## 2021-10-30 DIAGNOSIS — B351 Tinea unguium: Secondary | ICD-10-CM | POA: Diagnosis not present

## 2021-10-30 DIAGNOSIS — L84 Corns and callosities: Secondary | ICD-10-CM | POA: Diagnosis not present

## 2021-10-30 DIAGNOSIS — M79676 Pain in unspecified toe(s): Secondary | ICD-10-CM | POA: Diagnosis not present

## 2021-11-03 DIAGNOSIS — R140 Abdominal distension (gaseous): Secondary | ICD-10-CM | POA: Diagnosis not present

## 2021-11-03 DIAGNOSIS — R109 Unspecified abdominal pain: Secondary | ICD-10-CM | POA: Diagnosis not present

## 2021-11-03 DIAGNOSIS — R222 Localized swelling, mass and lump, trunk: Secondary | ICD-10-CM | POA: Diagnosis not present

## 2021-11-14 DIAGNOSIS — K219 Gastro-esophageal reflux disease without esophagitis: Secondary | ICD-10-CM | POA: Diagnosis not present

## 2021-11-14 DIAGNOSIS — R1311 Dysphagia, oral phase: Secondary | ICD-10-CM | POA: Diagnosis not present

## 2021-11-14 DIAGNOSIS — M6281 Muscle weakness (generalized): Secondary | ICD-10-CM | POA: Diagnosis not present

## 2021-11-14 DIAGNOSIS — I509 Heart failure, unspecified: Secondary | ICD-10-CM | POA: Diagnosis not present

## 2021-11-17 DIAGNOSIS — K219 Gastro-esophageal reflux disease without esophagitis: Secondary | ICD-10-CM | POA: Diagnosis not present

## 2021-11-17 DIAGNOSIS — R059 Cough, unspecified: Secondary | ICD-10-CM | POA: Diagnosis not present

## 2021-11-17 DIAGNOSIS — I509 Heart failure, unspecified: Secondary | ICD-10-CM | POA: Diagnosis not present

## 2021-11-17 DIAGNOSIS — R1311 Dysphagia, oral phase: Secondary | ICD-10-CM | POA: Diagnosis not present

## 2021-11-17 DIAGNOSIS — M6281 Muscle weakness (generalized): Secondary | ICD-10-CM | POA: Diagnosis not present

## 2021-11-18 DIAGNOSIS — R1311 Dysphagia, oral phase: Secondary | ICD-10-CM | POA: Diagnosis not present

## 2021-11-18 DIAGNOSIS — M6281 Muscle weakness (generalized): Secondary | ICD-10-CM | POA: Diagnosis not present

## 2021-11-18 DIAGNOSIS — I509 Heart failure, unspecified: Secondary | ICD-10-CM | POA: Diagnosis not present

## 2021-11-18 DIAGNOSIS — K219 Gastro-esophageal reflux disease without esophagitis: Secondary | ICD-10-CM | POA: Diagnosis not present

## 2021-11-19 DIAGNOSIS — K219 Gastro-esophageal reflux disease without esophagitis: Secondary | ICD-10-CM | POA: Diagnosis not present

## 2021-11-19 DIAGNOSIS — M6281 Muscle weakness (generalized): Secondary | ICD-10-CM | POA: Diagnosis not present

## 2021-11-19 DIAGNOSIS — R1311 Dysphagia, oral phase: Secondary | ICD-10-CM | POA: Diagnosis not present

## 2021-11-19 DIAGNOSIS — I509 Heart failure, unspecified: Secondary | ICD-10-CM | POA: Diagnosis not present

## 2021-11-20 DIAGNOSIS — R1311 Dysphagia, oral phase: Secondary | ICD-10-CM | POA: Diagnosis not present

## 2021-11-20 DIAGNOSIS — M79652 Pain in left thigh: Secondary | ICD-10-CM | POA: Diagnosis not present

## 2021-11-20 DIAGNOSIS — M25552 Pain in left hip: Secondary | ICD-10-CM | POA: Diagnosis not present

## 2021-11-20 DIAGNOSIS — I509 Heart failure, unspecified: Secondary | ICD-10-CM | POA: Diagnosis not present

## 2021-11-20 DIAGNOSIS — K219 Gastro-esophageal reflux disease without esophagitis: Secondary | ICD-10-CM | POA: Diagnosis not present

## 2021-11-20 DIAGNOSIS — M6281 Muscle weakness (generalized): Secondary | ICD-10-CM | POA: Diagnosis not present

## 2021-11-21 DIAGNOSIS — I509 Heart failure, unspecified: Secondary | ICD-10-CM | POA: Diagnosis not present

## 2021-11-21 DIAGNOSIS — R1311 Dysphagia, oral phase: Secondary | ICD-10-CM | POA: Diagnosis not present

## 2021-11-21 DIAGNOSIS — K219 Gastro-esophageal reflux disease without esophagitis: Secondary | ICD-10-CM | POA: Diagnosis not present

## 2021-11-21 DIAGNOSIS — M6281 Muscle weakness (generalized): Secondary | ICD-10-CM | POA: Diagnosis not present

## 2021-11-24 DIAGNOSIS — I509 Heart failure, unspecified: Secondary | ICD-10-CM | POA: Diagnosis not present

## 2021-11-24 DIAGNOSIS — R1311 Dysphagia, oral phase: Secondary | ICD-10-CM | POA: Diagnosis not present

## 2021-11-24 DIAGNOSIS — K219 Gastro-esophageal reflux disease without esophagitis: Secondary | ICD-10-CM | POA: Diagnosis not present

## 2021-11-24 DIAGNOSIS — M6281 Muscle weakness (generalized): Secondary | ICD-10-CM | POA: Diagnosis not present

## 2021-11-25 DIAGNOSIS — R1311 Dysphagia, oral phase: Secondary | ICD-10-CM | POA: Diagnosis not present

## 2021-11-25 DIAGNOSIS — K219 Gastro-esophageal reflux disease without esophagitis: Secondary | ICD-10-CM | POA: Diagnosis not present

## 2021-11-25 DIAGNOSIS — M6281 Muscle weakness (generalized): Secondary | ICD-10-CM | POA: Diagnosis not present

## 2021-11-25 DIAGNOSIS — I509 Heart failure, unspecified: Secondary | ICD-10-CM | POA: Diagnosis not present

## 2021-11-26 DIAGNOSIS — K219 Gastro-esophageal reflux disease without esophagitis: Secondary | ICD-10-CM | POA: Diagnosis not present

## 2021-11-26 DIAGNOSIS — R1311 Dysphagia, oral phase: Secondary | ICD-10-CM | POA: Diagnosis not present

## 2021-11-26 DIAGNOSIS — I509 Heart failure, unspecified: Secondary | ICD-10-CM | POA: Diagnosis not present

## 2021-11-26 DIAGNOSIS — M6281 Muscle weakness (generalized): Secondary | ICD-10-CM | POA: Diagnosis not present

## 2021-11-27 DIAGNOSIS — M6281 Muscle weakness (generalized): Secondary | ICD-10-CM | POA: Diagnosis not present

## 2021-11-27 DIAGNOSIS — K219 Gastro-esophageal reflux disease without esophagitis: Secondary | ICD-10-CM | POA: Diagnosis not present

## 2021-11-27 DIAGNOSIS — F32A Depression, unspecified: Secondary | ICD-10-CM | POA: Diagnosis not present

## 2021-11-27 DIAGNOSIS — D696 Thrombocytopenia, unspecified: Secondary | ICD-10-CM | POA: Diagnosis not present

## 2021-11-27 DIAGNOSIS — E119 Type 2 diabetes mellitus without complications: Secondary | ICD-10-CM | POA: Diagnosis not present

## 2021-11-27 DIAGNOSIS — I509 Heart failure, unspecified: Secondary | ICD-10-CM | POA: Diagnosis not present

## 2021-11-27 DIAGNOSIS — R1311 Dysphagia, oral phase: Secondary | ICD-10-CM | POA: Diagnosis not present

## 2021-11-28 DIAGNOSIS — M6281 Muscle weakness (generalized): Secondary | ICD-10-CM | POA: Diagnosis not present

## 2021-11-28 DIAGNOSIS — R1311 Dysphagia, oral phase: Secondary | ICD-10-CM | POA: Diagnosis not present

## 2021-11-28 DIAGNOSIS — K219 Gastro-esophageal reflux disease without esophagitis: Secondary | ICD-10-CM | POA: Diagnosis not present

## 2021-11-28 DIAGNOSIS — I509 Heart failure, unspecified: Secondary | ICD-10-CM | POA: Diagnosis not present

## 2021-12-01 DIAGNOSIS — M6281 Muscle weakness (generalized): Secondary | ICD-10-CM | POA: Diagnosis not present

## 2021-12-01 DIAGNOSIS — K219 Gastro-esophageal reflux disease without esophagitis: Secondary | ICD-10-CM | POA: Diagnosis not present

## 2021-12-01 DIAGNOSIS — R1311 Dysphagia, oral phase: Secondary | ICD-10-CM | POA: Diagnosis not present

## 2021-12-01 DIAGNOSIS — I509 Heart failure, unspecified: Secondary | ICD-10-CM | POA: Diagnosis not present

## 2021-12-02 DIAGNOSIS — R1311 Dysphagia, oral phase: Secondary | ICD-10-CM | POA: Diagnosis not present

## 2021-12-02 DIAGNOSIS — K219 Gastro-esophageal reflux disease without esophagitis: Secondary | ICD-10-CM | POA: Diagnosis not present

## 2021-12-02 DIAGNOSIS — I509 Heart failure, unspecified: Secondary | ICD-10-CM | POA: Diagnosis not present

## 2021-12-02 DIAGNOSIS — M6281 Muscle weakness (generalized): Secondary | ICD-10-CM | POA: Diagnosis not present

## 2021-12-03 DIAGNOSIS — I509 Heart failure, unspecified: Secondary | ICD-10-CM | POA: Diagnosis not present

## 2021-12-03 DIAGNOSIS — M6281 Muscle weakness (generalized): Secondary | ICD-10-CM | POA: Diagnosis not present

## 2021-12-03 DIAGNOSIS — K219 Gastro-esophageal reflux disease without esophagitis: Secondary | ICD-10-CM | POA: Diagnosis not present

## 2021-12-03 DIAGNOSIS — R1311 Dysphagia, oral phase: Secondary | ICD-10-CM | POA: Diagnosis not present

## 2021-12-04 DIAGNOSIS — R222 Localized swelling, mass and lump, trunk: Secondary | ICD-10-CM | POA: Diagnosis not present

## 2021-12-04 DIAGNOSIS — K219 Gastro-esophageal reflux disease without esophagitis: Secondary | ICD-10-CM | POA: Diagnosis not present

## 2021-12-04 DIAGNOSIS — M6281 Muscle weakness (generalized): Secondary | ICD-10-CM | POA: Diagnosis not present

## 2021-12-04 DIAGNOSIS — I509 Heart failure, unspecified: Secondary | ICD-10-CM | POA: Diagnosis not present

## 2021-12-04 DIAGNOSIS — E039 Hypothyroidism, unspecified: Secondary | ICD-10-CM | POA: Diagnosis not present

## 2021-12-04 DIAGNOSIS — J449 Chronic obstructive pulmonary disease, unspecified: Secondary | ICD-10-CM | POA: Diagnosis not present

## 2021-12-04 DIAGNOSIS — E1140 Type 2 diabetes mellitus with diabetic neuropathy, unspecified: Secondary | ICD-10-CM | POA: Diagnosis not present

## 2021-12-04 DIAGNOSIS — R1311 Dysphagia, oral phase: Secondary | ICD-10-CM | POA: Diagnosis not present

## 2021-12-04 LAB — TSH: TSH: 1.68 (ref 0.41–5.90)

## 2021-12-04 LAB — BASIC METABOLIC PANEL
BUN: 13 (ref 4–21)
Creatinine: 0.9 (ref 0.6–1.3)
Glucose: 263

## 2021-12-04 LAB — HEMOGLOBIN A1C: Hemoglobin A1C: 7.7

## 2021-12-05 DIAGNOSIS — K219 Gastro-esophageal reflux disease without esophagitis: Secondary | ICD-10-CM | POA: Diagnosis not present

## 2021-12-05 DIAGNOSIS — M6281 Muscle weakness (generalized): Secondary | ICD-10-CM | POA: Diagnosis not present

## 2021-12-05 DIAGNOSIS — R1311 Dysphagia, oral phase: Secondary | ICD-10-CM | POA: Diagnosis not present

## 2021-12-05 DIAGNOSIS — I509 Heart failure, unspecified: Secondary | ICD-10-CM | POA: Diagnosis not present

## 2021-12-08 DIAGNOSIS — K219 Gastro-esophageal reflux disease without esophagitis: Secondary | ICD-10-CM | POA: Diagnosis not present

## 2021-12-08 DIAGNOSIS — R1311 Dysphagia, oral phase: Secondary | ICD-10-CM | POA: Diagnosis not present

## 2021-12-08 DIAGNOSIS — I509 Heart failure, unspecified: Secondary | ICD-10-CM | POA: Diagnosis not present

## 2021-12-08 DIAGNOSIS — M6281 Muscle weakness (generalized): Secondary | ICD-10-CM | POA: Diagnosis not present

## 2021-12-10 ENCOUNTER — Ambulatory Visit: Payer: Medicare Other | Admitting: Nurse Practitioner

## 2021-12-16 DIAGNOSIS — Z79899 Other long term (current) drug therapy: Secondary | ICD-10-CM | POA: Diagnosis not present

## 2021-12-17 DIAGNOSIS — R1311 Dysphagia, oral phase: Secondary | ICD-10-CM | POA: Diagnosis not present

## 2021-12-17 DIAGNOSIS — K219 Gastro-esophageal reflux disease without esophagitis: Secondary | ICD-10-CM | POA: Diagnosis not present

## 2021-12-17 DIAGNOSIS — I509 Heart failure, unspecified: Secondary | ICD-10-CM | POA: Diagnosis not present

## 2021-12-17 DIAGNOSIS — J449 Chronic obstructive pulmonary disease, unspecified: Secondary | ICD-10-CM | POA: Diagnosis not present

## 2021-12-17 DIAGNOSIS — F32A Depression, unspecified: Secondary | ICD-10-CM | POA: Diagnosis not present

## 2021-12-18 ENCOUNTER — Telehealth: Payer: Self-pay | Admitting: Nurse Practitioner

## 2021-12-18 NOTE — Telephone Encounter (Signed)
Do you have labs on this patient for tomorrow? It says patient did LABS and Joy does not see them. Please advise. ?

## 2021-12-18 NOTE — Telephone Encounter (Signed)
I did not receive these labs and I will call [facility] to have these faxed to [place]. ?

## 2021-12-18 NOTE — Telephone Encounter (Signed)
I called [facility] at [phone] and spoke to [place] and gave him our fax number for him to make sure he has the labs faxed to [place]. Harrold Donath transferred me to the nurses station and it is just ringing and ringing and no one is answering and the phone disconnected. ?

## 2021-12-19 ENCOUNTER — Ambulatory Visit: Payer: Medicare Other | Admitting: Nurse Practitioner

## 2021-12-22 DIAGNOSIS — Z79899 Other long term (current) drug therapy: Secondary | ICD-10-CM | POA: Diagnosis not present

## 2021-12-22 DIAGNOSIS — H5713 Ocular pain, bilateral: Secondary | ICD-10-CM | POA: Diagnosis not present

## 2021-12-22 LAB — LIPID PANEL
LDL Cholesterol: 41
Triglycerides: 161 — AB (ref 40–160)

## 2021-12-30 DIAGNOSIS — I509 Heart failure, unspecified: Secondary | ICD-10-CM | POA: Diagnosis not present

## 2021-12-30 DIAGNOSIS — E119 Type 2 diabetes mellitus without complications: Secondary | ICD-10-CM | POA: Diagnosis not present

## 2021-12-30 DIAGNOSIS — D696 Thrombocytopenia, unspecified: Secondary | ICD-10-CM | POA: Diagnosis not present

## 2021-12-30 DIAGNOSIS — Z794 Long term (current) use of insulin: Secondary | ICD-10-CM | POA: Diagnosis not present

## 2022-01-01 DIAGNOSIS — W19XXXA Unspecified fall, initial encounter: Secondary | ICD-10-CM | POA: Diagnosis not present

## 2022-01-01 DIAGNOSIS — M25562 Pain in left knee: Secondary | ICD-10-CM | POA: Diagnosis not present

## 2022-01-08 DIAGNOSIS — E1151 Type 2 diabetes mellitus with diabetic peripheral angiopathy without gangrene: Secondary | ICD-10-CM | POA: Diagnosis not present

## 2022-01-08 DIAGNOSIS — B351 Tinea unguium: Secondary | ICD-10-CM | POA: Diagnosis not present

## 2022-01-08 DIAGNOSIS — M79676 Pain in unspecified toe(s): Secondary | ICD-10-CM | POA: Diagnosis not present

## 2022-01-08 DIAGNOSIS — L84 Corns and callosities: Secondary | ICD-10-CM | POA: Diagnosis not present

## 2022-01-08 NOTE — Patient Instructions (Signed)

## 2022-01-09 ENCOUNTER — Ambulatory Visit (INDEPENDENT_AMBULATORY_CARE_PROVIDER_SITE_OTHER): Payer: Medicare Other | Admitting: Nurse Practitioner

## 2022-01-09 ENCOUNTER — Encounter: Payer: Self-pay | Admitting: Nurse Practitioner

## 2022-01-09 VITALS — BP 102/66 | HR 83 | Ht 69.0 in

## 2022-01-09 DIAGNOSIS — E119 Type 2 diabetes mellitus without complications: Secondary | ICD-10-CM

## 2022-01-09 DIAGNOSIS — Z794 Long term (current) use of insulin: Secondary | ICD-10-CM | POA: Diagnosis not present

## 2022-01-09 DIAGNOSIS — E039 Hypothyroidism, unspecified: Secondary | ICD-10-CM | POA: Diagnosis not present

## 2022-01-09 MED ORDER — LEVEMIR FLEXTOUCH 100 UNIT/ML ~~LOC~~ SOPN: 80.0000 [IU] | PEN_INJECTOR | Freq: Every day | SUBCUTANEOUS | 3 refills | Status: DC

## 2022-01-09 NOTE — Progress Notes (Signed)
? ?                                                    Endocrinology Follow Up Note  ?     01/09/2022, 10:56 AM ? ? ?Subjective:  ? ? Patient ID: John DyerGeorge M Charity, male    DOB: 11/22/1940.  ?John Kirk is being seen in follow up after being seen in consultation for management of currently uncontrolled symptomatic diabetes requested by  Beatrix FettersKotturi, Vinay K, MD. ? ? ?Past Medical History:  ?Diagnosis Date  ? 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 Anxiety   ? Atrial fibrillation (HCC)   ? Bipolar disorder (HCC)   ? BPH (benign prostatic hyperplasia)   ? CHF (congestive heart failure) (HCC)   ? COPD (chronic obstructive pulmonary disease) (HCC)   ? CVA (cerebral vascular accident) [facility](HCC)   ? DM2 (diabetes mellitus, type 2) (HCC)   ? Gout   ? HOH (hard of hearing)   ? Hypertension   ? Hypothyroidism   ? Pneumonia   ? Seizures (HCC)   ? ? ?Past Surgical History:  ?Procedure Laterality Date  ? CHOLECYSTECTOMY    ? LUMBAR LAMINECTOMY    ? x 2  ? NEPHROLITHOTOMY Right   ? ? ?Social History  ? ?Socioeconomic History  ? Marital status: Widowed  ?  Spouse name: Not on file  ? Number of children: 1  ? Years of education: Not on file  ? Highest education level: Not on file  ?Occupational History  ? Not on file  ?Tobacco Use  ? Smoking status: Former  ?  Years: 20.00  ?  Types: Cigarettes  ? Smokeless tobacco: Former  ?  Types: Chew  ? Tobacco comments:  ?  smoked from [age]yr to 26 yr  ?Vaping Use  ? Vaping Use: Never used  ?Substance and Sexual Activity  ? Alcohol use: Never  ? Drug use: Never  ? Sexual activity: Not on file  ?Other Topics Concern  ? Not on file  ?Social History Narrative  ? Not on file  ? ?Social Determinants of Health  ? ?Financial Resource Strain: Not on file  ?Food Insecurity: Not on file  ?Transportation Needs: Not on file  ?Physical Activity: Not on file  ?Stress: Not on file  ?Social Connections: Not on file  ? ? ?Family History  ?Problem Relation Age of Onset  ?  Diabetes Mother   ? Thyroid disease Mother   ? Hypertension Mother   ? Hyperlipidemia Mother   ? Heart attack Mother   ? Cancer Mother   ? Hypertension Father   ? Cancer Father   ? ? ?Outpatient Encounter Medications as of 01/09/2022  ?Medication Sig  ? acetaminophen (TYLENOL) 500 MG tablet Take 650 mg by mouth in the morning.  ? albuterol (VENTOLIN HFA) 108 (90 Base) MCG/ACT inhaler Inhale 2 puffs into the lungs in the morning, at noon, and at bedtime. *May use every 4 hours as needed for wheezing  ? allopurinol (ZYLOPRIM) 100 MG tablet Take 100 mg by mouth daily.  ? ALPRAZolam (XANAX) 0.25 MG tablet Take 0.25 mg by mouth 2 (two) times daily as needed for anxiety.  ? apixaban (ELIQUIS) 5 MG TABS tablet Take 5 mg by mouth 2 (two) times daily.  ? ARIPiprazole (ABILIFY) 2 MG  tablet Take 2 mg by mouth in the morning.  ? BD AUTOSHIELD DUO 30G X 5 MM MISC   ? bisacodyl (DULCOLAX) 10 MG suppository Place 10 mg rectally daily as needed for moderate constipation.  ? Cholecalciferol (VITAMIN D3) 1.25 MG (50000 UT) CAPS Take 1 capsule by mouth once a week.  ? cloNIDine (CATAPRES) 0.1 MG tablet Take 0.1 mg by mouth daily as needed.  ? divalproex (DEPAKOTE) 125 MG DR tablet Take 125 mg by mouth every 12 (twelve) hours.  ? DULoxetine (CYMBALTA) 60 MG capsule Take 60 mg by mouth daily.  ? fenofibrate (TRICOR) 145 MG tablet Take 145 mg by mouth daily.  ? finasteride (PROSCAR) 5 MG tablet Take 1 tablet (5 mg total) by mouth daily.  ? furosemide (LASIX) 40 MG tablet Take 40 mg by mouth in the morning.   ? gabapentin (NEURONTIN) 100 MG capsule Take 100 mg by mouth in the morning and at bedtime.  ? hydrocortisone 2.5 % cream Apply 1 application topically daily at 6 (six) AM. Sparingly  to face/neck every evening  ? insulin aspart (NOVOLOG FLEXPEN) 100 UNIT/ML FlexPen Inject 12-18 Units into the skin 3 (three) times daily before meals. 90-150= 12u, 151-200= 13u, 201-250= 14u, 251-300= 15u, 301-350= 16u, 351-400= 17u, >400= 18u. Notify  provider if less than 70 or greater than 400  ? insulin detemir (LEVEMIR FLEXTOUCH) 100 UNIT/ML FlexPen Inject 80 Units into the skin at bedtime. Prime pen with 2u  prior to each use. Once opened leave at room temp  ? insulin lispro (HUMALOG) 100 UNIT/ML injection Inject 12 Units into the skin 3 (three) times daily before meals.  ? isosorbide mononitrate (IMDUR) 30 MG 24 hr tablet Take 30 mg by mouth in the morning.  ? ketoconazole (NIZORAL) 2 % shampoo Apply 1 application topically 3 (three) times a week. Evern Bio, and Thurs  ? levETIRAcetam (KEPPRA) 500 MG tablet Take 500 mg by mouth 2 (two) times daily.  ? levothyroxine (SYNTHROID) 125 MCG tablet Take 125 mcg by mouth daily.  ? lisinopril (ZESTRIL) 10 MG tablet Take 10 mg by mouth daily.  ? MAGNESIUM OXIDE PO Take 500 mg by mouth 2 (two) times daily.   ? metFORMIN (GLUCOPHAGE) 500 MG tablet Take 500 mg by mouth 2 (two) times daily with a meal.  ? mirabegron ER (MYRBETRIQ) 50 MG TB24 tablet Take 1 tablet (50 mg total) by mouth daily.  ? nitroGLYCERIN (NITROSTAT) 0.4 MG SL tablet Place 0.4 mg under the tongue every 5 (five) minutes as needed for chest pain.  ? OXYGEN Inhale 2 L into the lungs continuous. At Bedtime  ? rosuvastatin (CRESTOR) 10 MG tablet Take 10 mg by mouth in the morning.  ? senna-docusate (SENOKOT-S) 8.6-50 MG tablet Take 2 tablets by mouth 2 (two) times daily.  ? tamsulosin (FLOMAX) 0.4 MG CAPS capsule Take 0.4 mg by mouth in the morning. *Give after the same meal  ? [DISCONTINUED] insulin detemir (LEVEMIR FLEXTOUCH) 100 UNIT/ML FlexPen Inject 80 Units into the skin at bedtime. Prime pen with 2u  prior to each use. Once opened leave at room temp  ? ?No facility-administered encounter medications on file as of 01/09/2022.  ? ? ?ALLERGIES: ?Allergies  ?Allergen Reactions  ? Celery Oil   ?  Unknown-listed on [facility] provided by facility  ? Morphine And Related   ?  Unknown-listed on [facility] provided by facility  ? Sulfa Antibiotics   ? ? ?VACCINATION  STATUS: ? ?There is no immunization history  on file for this patient. ? ?Diabetes ?He presents for his follow-up diabetic visit. He has type 2 diabetes mellitus. Onset time: He was diagnosed at approximate age of 44 years. His disease course has been improving. There are no hypoglycemic associated symptoms. Pertinent negatives for hypoglycemia include no confusion, headaches, nervousness/anxiousness, pallor, seizures or tremors. Associated symptoms include blurred vision, fatigue and foot paresthesias. Pertinent negatives for diabetes include no chest pain, no polydipsia, no polyphagia, no polyuria, no weakness and no weight loss. There are no hypoglycemic complications. Symptoms are stable. Diabetic complications include a CVA, heart disease, nephropathy and peripheral neuropathy. Risk factors for coronary artery disease include male sex, obesity, sedentary lifestyle, tobacco exposure, family history, diabetes mellitus, hypertension and dyslipidemia. Current diabetic treatment includes intensive insulin program and oral agent (monotherapy). He is compliant with treatment all of the time (with assistance from assisted living facility staff). His weight is stable. He is following a generally unhealthy diet. When asked about meal planning, he reported none. He has not had a previous visit with a dietitian. He never participates in exercise. His home blood glucose trend is decreasing steadily. His breakfast blood glucose range is generally >200 mg/dl. His lunch blood glucose range is generally 140-180 mg/dl. His dinner blood glucose range is generally 180-200 mg/dl. His bedtime blood glucose range is generally 180-200 mg/dl. His overall blood glucose range is >200 mg/dl. (He presents today, accompanied by his care attendant from the nursing home, with his logs showing improved glycemic profile with above target fasting and near target postprandial readings.  His previsit A1c was 7.7% on 12/04/21, improving from last  visit of 8%.  There are no significant episodes of hypoglycemia documented or reported.  We again had to wait on his glucose logs to be faxed from the facility as they were not sent with him.) An ACE inhibit

## 2022-01-12 ENCOUNTER — Other Ambulatory Visit: Payer: Self-pay

## 2022-01-12 ENCOUNTER — Telehealth: Payer: Self-pay | Admitting: Nurse Practitioner

## 2022-01-12 DIAGNOSIS — Z79899 Other long term (current) drug therapy: Secondary | ICD-10-CM | POA: Diagnosis not present

## 2022-01-12 DIAGNOSIS — R52 Pain, unspecified: Secondary | ICD-10-CM | POA: Diagnosis not present

## 2022-01-12 MED ORDER — LEVEMIR FLEXTOUCH 100 UNIT/ML ~~LOC~~ SOPN: 80.0000 [IU] | PEN_INJECTOR | Freq: Every day | SUBCUTANEOUS | 3 refills | Status: AC

## 2022-01-12 NOTE — Telephone Encounter (Signed)
Called [facility] and spoke to [place] and she stated that they use [facility] in [place] for the patients medication. I have sent the [facility] to this pharmacy. Morrie Sheldon verbalized an understanding. ?

## 2022-01-12 NOTE — Telephone Encounter (Signed)
Called this pharmacy and Ladona Ridgel stated that they do not service this location. I will call [facility] to see which pharmacy they use. ?

## 2022-01-12 NOTE — Telephone Encounter (Signed)
Ladona Ridgel with [facility] left a Vm stating she received a RX for him but does not have him in their system - she is requesting a call back at [phone] ?

## 2022-01-13 DIAGNOSIS — I509 Heart failure, unspecified: Secondary | ICD-10-CM | POA: Diagnosis not present

## 2022-01-13 DIAGNOSIS — R296 Repeated falls: Secondary | ICD-10-CM | POA: Diagnosis not present

## 2022-01-14 DIAGNOSIS — R296 Repeated falls: Secondary | ICD-10-CM | POA: Diagnosis not present

## 2022-01-14 DIAGNOSIS — I509 Heart failure, unspecified: Secondary | ICD-10-CM | POA: Diagnosis not present

## 2022-01-15 DIAGNOSIS — R296 Repeated falls: Secondary | ICD-10-CM | POA: Diagnosis not present

## 2022-01-15 DIAGNOSIS — I509 Heart failure, unspecified: Secondary | ICD-10-CM | POA: Diagnosis not present

## 2022-01-16 DIAGNOSIS — I509 Heart failure, unspecified: Secondary | ICD-10-CM | POA: Diagnosis not present

## 2022-01-16 DIAGNOSIS — R296 Repeated falls: Secondary | ICD-10-CM | POA: Diagnosis not present

## 2022-01-19 DIAGNOSIS — Z79899 Other long term (current) drug therapy: Secondary | ICD-10-CM | POA: Diagnosis not present

## 2022-01-19 DIAGNOSIS — Z043 Encounter for examination and observation following other accident: Secondary | ICD-10-CM | POA: Diagnosis not present

## 2022-01-19 DIAGNOSIS — I509 Heart failure, unspecified: Secondary | ICD-10-CM | POA: Diagnosis not present

## 2022-01-19 DIAGNOSIS — R296 Repeated falls: Secondary | ICD-10-CM | POA: Diagnosis not present

## 2022-01-20 DIAGNOSIS — I509 Heart failure, unspecified: Secondary | ICD-10-CM | POA: Diagnosis not present

## 2022-01-20 DIAGNOSIS — R296 Repeated falls: Secondary | ICD-10-CM | POA: Diagnosis not present

## 2022-01-22 DIAGNOSIS — D696 Thrombocytopenia, unspecified: Secondary | ICD-10-CM | POA: Diagnosis not present

## 2022-01-22 DIAGNOSIS — Z79899 Other long term (current) drug therapy: Secondary | ICD-10-CM | POA: Diagnosis not present

## 2022-01-22 DIAGNOSIS — R296 Repeated falls: Secondary | ICD-10-CM | POA: Diagnosis not present

## 2022-01-22 DIAGNOSIS — I509 Heart failure, unspecified: Secondary | ICD-10-CM | POA: Diagnosis not present

## 2022-01-24 DIAGNOSIS — R296 Repeated falls: Secondary | ICD-10-CM | POA: Diagnosis not present

## 2022-01-24 DIAGNOSIS — I509 Heart failure, unspecified: Secondary | ICD-10-CM | POA: Diagnosis not present

## 2022-01-26 DIAGNOSIS — H5789 Other specified disorders of eye and adnexa: Secondary | ICD-10-CM | POA: Diagnosis not present

## 2022-01-26 DIAGNOSIS — R296 Repeated falls: Secondary | ICD-10-CM | POA: Diagnosis not present

## 2022-01-26 DIAGNOSIS — H1032 Unspecified acute conjunctivitis, left eye: Secondary | ICD-10-CM | POA: Diagnosis not present

## 2022-01-26 DIAGNOSIS — Z79899 Other long term (current) drug therapy: Secondary | ICD-10-CM | POA: Diagnosis not present

## 2022-01-26 DIAGNOSIS — I509 Heart failure, unspecified: Secondary | ICD-10-CM | POA: Diagnosis not present

## 2022-01-27 DIAGNOSIS — R296 Repeated falls: Secondary | ICD-10-CM | POA: Diagnosis not present

## 2022-01-27 DIAGNOSIS — I509 Heart failure, unspecified: Secondary | ICD-10-CM | POA: Diagnosis not present

## 2022-01-28 DIAGNOSIS — R296 Repeated falls: Secondary | ICD-10-CM | POA: Diagnosis not present

## 2022-01-28 DIAGNOSIS — I509 Heart failure, unspecified: Secondary | ICD-10-CM | POA: Diagnosis not present

## 2022-01-29 DIAGNOSIS — Z79899 Other long term (current) drug therapy: Secondary | ICD-10-CM | POA: Diagnosis not present

## 2022-01-29 DIAGNOSIS — I509 Heart failure, unspecified: Secondary | ICD-10-CM | POA: Diagnosis not present

## 2022-01-29 DIAGNOSIS — R296 Repeated falls: Secondary | ICD-10-CM | POA: Diagnosis not present

## 2022-01-29 DIAGNOSIS — D696 Thrombocytopenia, unspecified: Secondary | ICD-10-CM | POA: Diagnosis not present

## 2022-01-30 DIAGNOSIS — R296 Repeated falls: Secondary | ICD-10-CM | POA: Diagnosis not present

## 2022-01-30 DIAGNOSIS — I509 Heart failure, unspecified: Secondary | ICD-10-CM | POA: Diagnosis not present

## 2022-02-02 DIAGNOSIS — I509 Heart failure, unspecified: Secondary | ICD-10-CM | POA: Diagnosis not present

## 2022-02-02 DIAGNOSIS — R296 Repeated falls: Secondary | ICD-10-CM | POA: Diagnosis not present

## 2022-02-03 DIAGNOSIS — I509 Heart failure, unspecified: Secondary | ICD-10-CM | POA: Diagnosis not present

## 2022-02-03 DIAGNOSIS — R296 Repeated falls: Secondary | ICD-10-CM | POA: Diagnosis not present

## 2022-02-04 DIAGNOSIS — R296 Repeated falls: Secondary | ICD-10-CM | POA: Diagnosis not present

## 2022-02-04 DIAGNOSIS — I509 Heart failure, unspecified: Secondary | ICD-10-CM | POA: Diagnosis not present

## 2022-02-05 DIAGNOSIS — J449 Chronic obstructive pulmonary disease, unspecified: Secondary | ICD-10-CM | POA: Diagnosis not present

## 2022-02-05 DIAGNOSIS — I10 Essential (primary) hypertension: Secondary | ICD-10-CM | POA: Diagnosis not present

## 2022-02-05 DIAGNOSIS — I509 Heart failure, unspecified: Secondary | ICD-10-CM | POA: Diagnosis not present

## 2022-02-05 DIAGNOSIS — R296 Repeated falls: Secondary | ICD-10-CM | POA: Diagnosis not present

## 2022-02-05 DIAGNOSIS — I4891 Unspecified atrial fibrillation: Secondary | ICD-10-CM | POA: Diagnosis not present

## 2022-02-09 DIAGNOSIS — I509 Heart failure, unspecified: Secondary | ICD-10-CM | POA: Diagnosis not present

## 2022-02-09 DIAGNOSIS — R296 Repeated falls: Secondary | ICD-10-CM | POA: Diagnosis not present

## 2022-02-12 ENCOUNTER — Ambulatory Visit (INDEPENDENT_AMBULATORY_CARE_PROVIDER_SITE_OTHER): Payer: Medicare Other | Admitting: Urology

## 2022-02-12 ENCOUNTER — Encounter: Payer: Self-pay | Admitting: Urology

## 2022-02-12 VITALS — BP 123/66 | HR 72

## 2022-02-12 DIAGNOSIS — R339 Retention of urine, unspecified: Secondary | ICD-10-CM | POA: Diagnosis not present

## 2022-02-12 DIAGNOSIS — N401 Enlarged prostate with lower urinary tract symptoms: Secondary | ICD-10-CM

## 2022-02-12 DIAGNOSIS — R32 Unspecified urinary incontinence: Secondary | ICD-10-CM | POA: Diagnosis not present

## 2022-02-12 DIAGNOSIS — N138 Other obstructive and reflux uropathy: Secondary | ICD-10-CM | POA: Diagnosis not present

## 2022-02-12 DIAGNOSIS — R351 Nocturia: Secondary | ICD-10-CM

## 2022-02-12 NOTE — Progress Notes (Signed)
?Subjective: ?1. BPH with urinary obstruction   ?2. Incomplete bladder emptying   ?3. Nocturia   ?4. Urinary incontinence, unspecified type   ?  ?02/12/22: John Kirk returns today in f/u for the history below.  He wasn't able to use the condom catheter.  He continues to have severe incontinence.  His PVR is up to . It was 2 at his last visit.  He remains on finasteride, tamsulosin and mybetriq.  He can stand but he can't walk.   ? ?08/14/21: John Kirk is a 81 yo male who returns in f/u for his history of urinary incontinence which has been chronic. He remains on Myrbetriq and tamsulosin and had finasteride added at his last visit.  His PVR today is down to 55ml from .  He is wearing pull-ups which he changed 2-3x daily.  He will go and void but has urgency and he will leak.  He is able to stand but can't walk now. He has frequency more often that q1hr.  He has nocturnal incontinence that can soak the bed through the diaper.  He has some dysuria.   He has a history of stones and a left nephrolithotomy. He has a history of BPH with BOO  and has had no prostate surgery.    ? ? ? ? ?ROS: ? ?ROS ? ?Allergies  ?Allergen Reactions  ? Celery Oil   ?  Unknown-listed on [facility] provided by facility  ? Morphine And Related   ?  Unknown-listed on [facility] provided by facility  ? Sulfa Antibiotics   ? ? ?Past Medical History:  ?Diagnosis Date  ? 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 Anxiety   ? Atrial fibrillation (HCC)   ? Bipolar disorder (HCC)   ? BPH (benign prostatic hyperplasia)   ? CHF (congestive heart failure) (HCC)   ? COPD (chronic obstructive pulmonary disease) (HCC)   ? CVA (cerebral vascular accident) [facility])   ? DM2 (diabetes mellitus, type 2) (HCC)   ? Gout   ? HOH (hard of hearing)   ? Hypertension   ? Hypothyroidism   ? Pneumonia   ? Seizures (HCC)   ? ? ?Past Surgical History:  ?Procedure Laterality Date  ? CHOLECYSTECTOMY    ? LUMBAR LAMINECTOMY    ? x 2  ? NEPHROLITHOTOMY Right    ? ? ?Social History  ? ?Socioeconomic History  ? Marital status: Widowed  ?  Spouse name: Not on file  ? Number of children: 1  ? Years of education: Not on file  ? Highest education level: Not on file  ?Occupational History  ? Not on file  ?Tobacco Use  ? Smoking status: Former  ?  Years: 20.00  ?  Types: Cigarettes  ? Smokeless tobacco: Former  ?  Types: Chew  ? Tobacco comments:  ?  smoked from 70yr to 26 yr  ?Vaping Use  ? Vaping Use: Never used  ?Substance and Sexual Activity  ? Alcohol use: Never  ? Drug use: Never  ? Sexual activity: Not on file  ?Other Topics Concern  ? Not on file  ?Social History Narrative  ? Not on file  ? ?Social Determinants of Health  ? ?Financial Resource Strain: Not on file  ?Food Insecurity: Not on file  ?Transportation Needs: Not on file  ?Physical Activity: Not on file  ?Stress: Not on file  ?Social Connections: Not on file  ?Intimate Partner Violence: Not on file  ? ? ?Family History  ?  Problem Relation Age of Onset  ? Diabetes Mother   ? Thyroid disease Mother   ? Hypertension Mother   ? Hyperlipidemia Mother   ? Heart attack Mother   ? Cancer Mother   ? Hypertension Father   ? Cancer Father   ? ? ?Anti-infectives: ?Anti-infectives (From admission, onward)  ? ? None  ? ?  ? ? ?Current Outpatient Medications  ?Medication Sig Dispense Refill  ? acetaminophen (TYLENOL) 500 MG tablet Take 650 mg by mouth in the morning.    ? albuterol (VENTOLIN HFA) 108 (90 Base) MCG/ACT inhaler Inhale 2 puffs into the lungs in the morning, at noon, and at bedtime. *May use every 4 hours as needed for wheezing    ? allopurinol (ZYLOPRIM) 100 MG tablet Take 100 mg by mouth daily.    ? ALPRAZolam (XANAX) 0.25 MG tablet Take 0.25 mg by mouth 2 (two) times daily as needed for anxiety.    ? apixaban (ELIQUIS) 5 MG TABS tablet Take 5 mg by mouth 2 (two) times daily.    ? ARIPiprazole (ABILIFY) 2 MG tablet Take 2 mg by mouth in the morning.    ? BD AUTOSHIELD DUO 30G X 5 MM MISC     ? bisacodyl (DULCOLAX)  10 MG suppository Place 10 mg rectally daily as needed for moderate constipation.    ? Cholecalciferol (VITAMIN D3) 1.25 MG (50000 UT) CAPS Take 1 capsule by mouth once a week.    ? cloNIDine (CATAPRES) 0.1 MG tablet Take 0.1 mg by mouth daily as needed.    ? divalproex (DEPAKOTE) 125 MG DR tablet Take 125 mg by mouth every 12 (twelve) hours.    ? DULoxetine (CYMBALTA) 60 MG capsule Take 60 mg by mouth daily.    ? fenofibrate (TRICOR) 145 MG tablet Take 145 mg by mouth daily.    ? finasteride (PROSCAR) 5 MG tablet Take 1 tablet (5 mg total) by mouth daily. 90 tablet 3  ? furosemide (LASIX) 40 MG tablet Take 40 mg by mouth in the morning.     ? gabapentin (NEURONTIN) 100 MG capsule Take 100 mg by mouth in the morning and at bedtime.    ? gabapentin (NEURONTIN) 100 MG capsule Take by mouth.    ? hydrocortisone 2.5 % cream Apply 1 application topically daily at 6 (six) AM. Sparingly  to face/neck every evening    ? insulin aspart (NOVOLOG FLEXPEN) 100 UNIT/ML FlexPen Inject 12-18 Units into the skin 3 (three) times daily before meals. 90-150= 12u, 151-200= 13u, 201-250= 14u, 251-300= 15u, 301-350= 16u, 351-400= 17u, >400= 18u. Notify provider if less than 70 or greater than 400    ? insulin detemir (LEVEMIR FLEXTOUCH) 100 UNIT/ML FlexPen Inject 80 Units into the skin at bedtime. Prime pen with 2u  prior to each use. Once opened leave at room temp 45 mL 3  ? insulin lispro (HUMALOG) 100 UNIT/ML injection Inject 12 Units into the skin 3 (three) times daily before meals.    ? isosorbide mononitrate (IMDUR) 30 MG 24 hr tablet Take 30 mg by mouth in the morning.    ? ketoconazole (NIZORAL) 2 % shampoo Apply 1 application topically 3 (three) times a week. Evern BioSun, Tues, and Thurs    ? levETIRAcetam (KEPPRA) 500 MG tablet Take 500 mg by mouth 2 (two) times daily.    ? levothyroxine (SYNTHROID) 125 MCG tablet Take 125 mcg by mouth daily.    ? lisinopril (ZESTRIL) 10 MG tablet Take 10 mg by mouth  daily.    ? MAGNESIUM OXIDE PO Take  500 mg by mouth 2 (two) times daily.     ? metFORMIN (GLUCOPHAGE) 500 MG tablet Take 500 mg by mouth 2 (two) times daily with a meal.    ? mirabegron ER (MYRBETRIQ) 50 MG TB24 tablet Take 1 tablet (50 mg total) by mouth daily. 28 tablet 0  ? nitroGLYCERIN (NITROSTAT) 0.4 MG SL tablet Place 0.4 mg under the tongue every 5 (five) minutes as needed for chest pain.    ? OXYGEN Inhale 2 L into the lungs continuous. At Bedtime    ? rosuvastatin (CRESTOR) 10 MG tablet Take 10 mg by mouth in the morning.    ? senna-docusate (SENOKOT-S) 8.6-50 MG tablet Take 2 tablets by mouth 2 (two) times daily.    ? tamsulosin (FLOMAX) 0.4 MG CAPS capsule Take 0.4 mg by mouth in the morning. *Give after the same meal    ? divalproex (DEPAKOTE) 250 MG DR tablet Take by mouth.    ? DULoxetine (CYMBALTA) 30 MG capsule Take 30 mg by mouth daily.    ? ?No current facility-administered medications for this visit.  ? ? ? ?Objective: ?Vital signs in last 24 hours: ?BP 123/66   Pulse 72  ? ?Intake/Output from previous day: ?No intake/output data recorded. ?Intake/Output this shift: ?@IOTHISSHIFT @ ? ? ?Physical Exam ? ?Lab Results:  ?No results found for this or any previous visit (from the past 24 hour(s)). ? ?  ?BMET ?No results for input(s): NA, K, CL, CO2, GLUCOSE, BUN, CREATININE, CALCIUM in the last 72 hours. ?PT/INR ?No results for input(s): LABPROT, INR in the last 72 hours. ?ABG ?No results for input(s): PHART, HCO3 in the last 72 hours. ? ?Invalid input(s): PCO2, PO2 ? ?Studies/Results: ?16 fr foley was placed to a drainage bag using sterile technique.   ? ?Assessment/Plan: ?OAB wet with incomplete bladder emptying.  A foley catheter was placed since the condom cath would stay in place.  He will return in a month for a catheter change.   ? ?No orders of the defined types were placed in this encounter. ?  ? ?Orders Placed This Encounter  ?Procedures  ? Urine Culture  ? Urinalysis, Routine w reflex microscopic  ? BLADDER SCAN AMB  NON-IMAGING  ?  ? ?Return in about 4 weeks (around 03/12/2022) for for a foley exchange. .  ? ? ?CC: 05/12/2022 MD ? ? ? ?John Kirk ?02/12/2022 ?04/14/2022 ID: [id number], male   DOB: 03/05/1941,

## 2022-02-12 NOTE — Progress Notes (Signed)
post void residual= 552 ? ? ?Simple Catheter Placement ? ?Due to urinary retention patient is present today for a foley cath placement.  Patient was cleaned and prepped in a sterile fashion with betadine. A 16 FR foley catheter was inserted, urine return was noted  , urine was yellow in color.  The balloon was filled with 10cc of sterile water.  A bedside  bag was attached for drainage. Patient was also given a night bag to take home and was given instruction on how to change from one bag to another.  Patient was given instruction on proper catheter care.  Patient tolerated well, no complications were noted  ? ?Performed by: Jemell Town LPN ? ?Additional notes/ Follow up: Per MD note  ?

## 2022-02-13 DIAGNOSIS — E7800 Pure hypercholesterolemia, unspecified: Secondary | ICD-10-CM | POA: Diagnosis not present

## 2022-02-13 DIAGNOSIS — D649 Anemia, unspecified: Secondary | ICD-10-CM | POA: Diagnosis not present

## 2022-02-13 DIAGNOSIS — E119 Type 2 diabetes mellitus without complications: Secondary | ICD-10-CM | POA: Diagnosis not present

## 2022-02-13 DIAGNOSIS — Z79899 Other long term (current) drug therapy: Secondary | ICD-10-CM | POA: Diagnosis not present

## 2022-02-13 LAB — URINALYSIS, ROUTINE W REFLEX MICROSCOPIC
Bilirubin, UA: NEGATIVE
Glucose, UA: NEGATIVE
Ketones, UA: NEGATIVE
Leukocytes,UA: NEGATIVE
Nitrite, UA: NEGATIVE
Protein,UA: NEGATIVE
RBC, UA: NEGATIVE
Specific Gravity, UA: 1.015 (ref 1.005–1.030)
Urobilinogen, Ur: 0.2 mg/dL (ref 0.2–1.0)
pH, UA: 7 (ref 5.0–7.5)

## 2022-02-14 LAB — URINE CULTURE: Organism ID, Bacteria: NO GROWTH

## 2022-02-16 DIAGNOSIS — Z79899 Other long term (current) drug therapy: Secondary | ICD-10-CM | POA: Diagnosis not present

## 2022-02-16 DIAGNOSIS — R339 Retention of urine, unspecified: Secondary | ICD-10-CM | POA: Diagnosis not present

## 2022-02-16 DIAGNOSIS — Z978 Presence of other specified devices: Secondary | ICD-10-CM | POA: Diagnosis not present

## 2022-02-20 DIAGNOSIS — Z79899 Other long term (current) drug therapy: Secondary | ICD-10-CM | POA: Diagnosis not present

## 2022-02-20 DIAGNOSIS — Z87898 Personal history of other specified conditions: Secondary | ICD-10-CM | POA: Diagnosis not present

## 2022-02-25 DIAGNOSIS — R0989 Other specified symptoms and signs involving the circulatory and respiratory systems: Secondary | ICD-10-CM | POA: Diagnosis not present

## 2022-02-26 DIAGNOSIS — R1900 Intra-abdominal and pelvic swelling, mass and lump, unspecified site: Secondary | ICD-10-CM | POA: Diagnosis not present

## 2022-02-26 DIAGNOSIS — Z79899 Other long term (current) drug therapy: Secondary | ICD-10-CM | POA: Diagnosis not present

## 2022-02-26 DIAGNOSIS — R109 Unspecified abdominal pain: Secondary | ICD-10-CM | POA: Diagnosis not present

## 2022-02-26 DIAGNOSIS — R410 Disorientation, unspecified: Secondary | ICD-10-CM | POA: Diagnosis not present

## 2022-02-26 DIAGNOSIS — M6281 Muscle weakness (generalized): Secondary | ICD-10-CM | POA: Diagnosis not present

## 2022-02-27 DIAGNOSIS — R309 Painful micturition, unspecified: Secondary | ICD-10-CM | POA: Diagnosis not present

## 2022-03-02 DIAGNOSIS — N390 Urinary tract infection, site not specified: Secondary | ICD-10-CM | POA: Diagnosis not present

## 2022-03-02 DIAGNOSIS — R1311 Dysphagia, oral phase: Secondary | ICD-10-CM | POA: Diagnosis not present

## 2022-03-02 DIAGNOSIS — E538 Deficiency of other specified B group vitamins: Secondary | ICD-10-CM | POA: Diagnosis not present

## 2022-03-02 DIAGNOSIS — R7989 Other specified abnormal findings of blood chemistry: Secondary | ICD-10-CM | POA: Diagnosis not present

## 2022-03-02 DIAGNOSIS — I509 Heart failure, unspecified: Secondary | ICD-10-CM | POA: Diagnosis not present

## 2022-03-03 DIAGNOSIS — R1311 Dysphagia, oral phase: Secondary | ICD-10-CM | POA: Diagnosis not present

## 2022-03-03 DIAGNOSIS — I509 Heart failure, unspecified: Secondary | ICD-10-CM | POA: Diagnosis not present

## 2022-03-04 DIAGNOSIS — I509 Heart failure, unspecified: Secondary | ICD-10-CM | POA: Diagnosis not present

## 2022-03-04 DIAGNOSIS — R1311 Dysphagia, oral phase: Secondary | ICD-10-CM | POA: Diagnosis not present

## 2022-03-05 DIAGNOSIS — R1311 Dysphagia, oral phase: Secondary | ICD-10-CM | POA: Diagnosis not present

## 2022-03-05 DIAGNOSIS — I509 Heart failure, unspecified: Secondary | ICD-10-CM | POA: Diagnosis not present

## 2022-03-06 DIAGNOSIS — R1311 Dysphagia, oral phase: Secondary | ICD-10-CM | POA: Diagnosis not present

## 2022-03-06 DIAGNOSIS — I509 Heart failure, unspecified: Secondary | ICD-10-CM | POA: Diagnosis not present

## 2022-03-09 DIAGNOSIS — R1311 Dysphagia, oral phase: Secondary | ICD-10-CM | POA: Diagnosis not present

## 2022-03-09 DIAGNOSIS — I509 Heart failure, unspecified: Secondary | ICD-10-CM | POA: Diagnosis not present

## 2022-03-10 DIAGNOSIS — R569 Unspecified convulsions: Secondary | ICD-10-CM | POA: Diagnosis not present

## 2022-03-10 DIAGNOSIS — Z79899 Other long term (current) drug therapy: Secondary | ICD-10-CM | POA: Diagnosis not present

## 2022-03-10 DIAGNOSIS — G4714 Hypersomnia due to medical condition: Secondary | ICD-10-CM | POA: Diagnosis not present

## 2022-03-10 DIAGNOSIS — R2689 Other abnormalities of gait and mobility: Secondary | ICD-10-CM | POA: Diagnosis not present

## 2022-03-10 DIAGNOSIS — I509 Heart failure, unspecified: Secondary | ICD-10-CM | POA: Diagnosis not present

## 2022-03-10 DIAGNOSIS — I69353 Hemiplegia and hemiparesis following cerebral infarction affecting right non-dominant side: Secondary | ICD-10-CM | POA: Diagnosis not present

## 2022-03-10 DIAGNOSIS — R1311 Dysphagia, oral phase: Secondary | ICD-10-CM | POA: Diagnosis not present

## 2022-03-11 DIAGNOSIS — R1311 Dysphagia, oral phase: Secondary | ICD-10-CM | POA: Diagnosis not present

## 2022-03-11 DIAGNOSIS — I509 Heart failure, unspecified: Secondary | ICD-10-CM | POA: Diagnosis not present

## 2022-03-12 ENCOUNTER — Ambulatory Visit (INDEPENDENT_AMBULATORY_CARE_PROVIDER_SITE_OTHER): Payer: Medicare Other | Admitting: Physician Assistant

## 2022-03-12 DIAGNOSIS — R339 Retention of urine, unspecified: Secondary | ICD-10-CM

## 2022-03-12 DIAGNOSIS — R32 Unspecified urinary incontinence: Secondary | ICD-10-CM | POA: Diagnosis not present

## 2022-03-12 DIAGNOSIS — R310 Gross hematuria: Secondary | ICD-10-CM

## 2022-03-12 DIAGNOSIS — I509 Heart failure, unspecified: Secondary | ICD-10-CM | POA: Diagnosis not present

## 2022-03-12 DIAGNOSIS — N401 Enlarged prostate with lower urinary tract symptoms: Secondary | ICD-10-CM | POA: Diagnosis not present

## 2022-03-12 DIAGNOSIS — R300 Dysuria: Secondary | ICD-10-CM | POA: Diagnosis not present

## 2022-03-12 DIAGNOSIS — N138 Other obstructive and reflux uropathy: Secondary | ICD-10-CM | POA: Diagnosis not present

## 2022-03-12 DIAGNOSIS — R296 Repeated falls: Secondary | ICD-10-CM | POA: Diagnosis not present

## 2022-03-12 DIAGNOSIS — R1311 Dysphagia, oral phase: Secondary | ICD-10-CM | POA: Diagnosis not present

## 2022-03-12 LAB — BLADDER SCAN AMB NON-IMAGING: Scan Result: 41

## 2022-03-12 NOTE — Progress Notes (Signed)
Assessment: 1. Urinary incontinence, unspecified type - BLADDER SCAN AMB NON-IMAGING  2. BPH with urinary obstruction  3. Incomplete bladder emptying    Plan: As the patient adamantly declines indwelling catheter and has struggled with a condom catheter, he is given the option of a pure wick system if his facility can provide this for continuing diapers to contain his ongoing leakage.  We are unable to obtain a urine specimen today as the patient only had 41 mL of urine and declined in and out cath for specimen.  He will continue current meds and orders are given for urinalysis as needed if he develops UTI symptoms.  He will follow-up with Dr. Jeffie Kirk in 6 months, sooner if he has concerns.  Chief Complaint: No chief complaint on file.   HPI: John Kirk is a 81 y.o. male who presents for continued evaluation of chronic ongoing urinary incontinence.  He continues to have ongoing issues with incontinence on finasteride, tamsulosin and Myrbetriq.  He is still having significant issues keeping the condom catheter on and remains somewhat immobile in the wheelchair except to stand.  He would like to discuss other options for his incontinence, but is adamant that he does not want an indwelling Foley.  Urinary burning, dysuria, gross hematuria.  Urine culture on 5 4 revealed no growth.  PVR=80ml  02/12/22: John Kirk returns today in f/u for the history below.  He wasn't able to use the condom catheter.  He continues to have severe incontinence.  His PVR is up to 526ml. It was 2 at his last visit.  He remains on finasteride, tamsulosin and mybetriq.  He can stand but he can't walk.     08/14/21: John Kirk is a 81 yo male who returns in f/u for his history of urinary incontinence which has been chronic. He remains on Myrbetriq and tamsulosin and had finasteride added at his last visit.  His PVR today is down to 61ml from 230ml.  He is wearing pull-ups which he changed 2-3x daily.  He will go and void  but has urgency and he will leak.  He is able to stand but can't walk now. He has frequency more often that q1hr.  He has nocturnal incontinence that can soak the bed through the diaper.  He has some dysuria.   He has a history of stones and a left nephrolithotomy. He has a history of BPH with BOO  and has had no prostate surgery.       Portions of the above documentation were copied from a prior visit for review purposes only.  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) (HCC)    Anxiety    Atrial fibrillation (HCC)    Bipolar disorder (HCC)    BPH (benign prostatic hyperplasia)    CHF (congestive heart failure) (HCC)    COPD (chronic obstructive pulmonary disease) (HCC)    CVA (cerebral vascular accident) ([place])    DM2 (diabetes mellitus, type 2) ([place])    Gout    HOH (hard of hearing)    Hypertension    Hypothyroidism    Pneumonia    Seizures (HCC)     PSH: Past Surgical  History:  Procedure Laterality Date   CHOLECYSTECTOMY     LUMBAR LAMINECTOMY     x 2   NEPHROLITHOTOMY Right     SH: Social History   Tobacco Use   Smoking status: Former    Years: 20.00    Types: Cigarettes   Smokeless tobacco: Former    Types: Chew   Tobacco comments:    smoked from 18yr to 40 yr  Vaping Use   Vaping Use: Never used  Substance Use Topics   Alcohol use: Never   Drug use: Never    ROS: See HPI  PE: There were no vitals taken for this visit.  The patient declined vitals today. GENERAL APPEARANCE:  Well appearing, well developed, well nourished, NAD HEENT:  Atraumatic, normocephalic NECK:  Supple. Trachea midline ABDOMEN:  Soft, non-tender, no masses, wearing his diaper EXTREMITIES:   Moves all extremities well, in a wheelchair NEUROLOGIC:  Alert and oriented x 3 MENTAL STATUS:  appropriate BACK:  Non-tender to palpation, No CVAT SKIN:  Warm, dry, and intact   Results: Laboratory Data: Lab Results  Component Value Date   WBC 4.6 10/14/2021   HGB 13.7 10/14/2021   HCT 42 10/14/2021   MCV 95.3 12/31/2020   PLT 97 (A) 10/14/2021    Lab Results  Component Value Date   CREATININE 0.9 12/04/2021    No results found for: PSA  No results found for: TESTOSTERONE  Lab Results  Component Value Date   HGBA1C 7.7 12/04/2021    Urinalysis    Component Value Date/Time   COLORURINE YELLOW 08/21/2021 2045   APPEARANCEUR Clear 02/12/2022 1630   LABSPEC 1.019 08/21/2021 2045   PHURINE 5.0 08/21/2021 2045   GLUCOSEU Negative 02/12/2022 1630   HGBUR NEGATIVE 08/21/2021 2045   BILIRUBINUR Negative 02/12/2022 [place] 08/21/2021 2045   PROTEINUR Negative 02/12/2022 [place] 08/21/2021 2045   NITRITE Negative 02/12/2022 1630   NITRITE NEGATIVE 08/21/2021 2045   LEUKOCYTESUR Negative 02/12/2022 1630   LEUKOCYTESUR NEGATIVE 08/21/2021 2045    Lab Results  Component Value Date   LABMICR Comment 02/12/2022   WBCUA 0-5 08/14/2021   LABEPIT 0-10 08/14/2021   BACTERIA None seen 08/14/2021    Pertinent Imaging:  No results found for this or any previous visit.  No results found for this or any previous visit.  No results found for this or any previous visit.  No results found for this or any previous visit.  No results found for this or any previous visit.  No results found for this or any previous visit.  No results found for this or any previous visit.  No results found for this or any previous visit.  Results for orders placed or performed in visit on 03/12/22 (from the past 24 hour(s))  BLADDER SCAN AMB NON-IMAGING   Collection Time: 03/12/22 10:44 AM  Result Value Ref Range   Scan Result 41

## 2022-03-12 NOTE — Progress Notes (Signed)
post void residual =41mL 

## 2022-03-13 ENCOUNTER — Encounter: Payer: Self-pay | Admitting: Nurse Practitioner

## 2022-03-13 ENCOUNTER — Ambulatory Visit (INDEPENDENT_AMBULATORY_CARE_PROVIDER_SITE_OTHER): Payer: Medicare Other | Admitting: Nurse Practitioner

## 2022-03-13 VITALS — BP 126/71 | HR 61 | Ht 69.0 in

## 2022-03-13 DIAGNOSIS — E039 Hypothyroidism, unspecified: Secondary | ICD-10-CM | POA: Diagnosis not present

## 2022-03-13 DIAGNOSIS — R296 Repeated falls: Secondary | ICD-10-CM | POA: Diagnosis not present

## 2022-03-13 DIAGNOSIS — E119 Type 2 diabetes mellitus without complications: Secondary | ICD-10-CM

## 2022-03-13 DIAGNOSIS — Z794 Long term (current) use of insulin: Secondary | ICD-10-CM

## 2022-03-13 DIAGNOSIS — R7989 Other specified abnormal findings of blood chemistry: Secondary | ICD-10-CM | POA: Diagnosis not present

## 2022-03-13 DIAGNOSIS — I509 Heart failure, unspecified: Secondary | ICD-10-CM | POA: Diagnosis not present

## 2022-03-13 DIAGNOSIS — R1311 Dysphagia, oral phase: Secondary | ICD-10-CM | POA: Diagnosis not present

## 2022-03-13 LAB — POCT GLYCOSYLATED HEMOGLOBIN (HGB A1C): HbA1c POC (<> result, manual entry): 7.8 % (ref 4.0–5.6)

## 2022-03-13 NOTE — Progress Notes (Signed)
Endocrinology Follow Up Note       03/13/2022, 10:07 AM   Subjective:    Patient ID: John Kirk, male    DOB: 01-27-1941.  John Kirk is being seen in follow up after being seen in consultation for management of currently uncontrolled symptomatic diabetes requested by  Beatrix Fetters, MD.   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) (HCC)    Anxiety    Atrial fibrillation (HCC)    Bipolar disorder (HCC)    BPH (benign prostatic hyperplasia)    CHF (congestive heart failure) (HCC)    COPD (chronic obstructive pulmonary disease) (HCC)    CVA (cerebral vascular accident) (HCC)    DM2 (diabetes mellitus, type 2) (HCC)    Gout    HOH (hard of hearing)    Hypertension    Hypothyroidism    Pneumonia    Seizures (HCC)     Past Surgical History:  Procedure Laterality Date   CHOLECYSTECTOMY     LUMBAR LAMINECTOMY     x 2   NEPHROLITHOTOMY Right     Social History   Socioeconomic History   Marital status: Widowed    Spouse name: Not on file   Number of children: 1   Years of education: Not on file   Highest education level: Not on file  Occupational History   Not on file  Tobacco Use   Smoking status: Former    Years: 20.00    Types: Cigarettes   Smokeless tobacco: Former    Types: Chew   Tobacco comments:    smoked from 47yr to 26 yr  Vaping Use   Vaping Use: Never used  Substance and Sexual Activity   Alcohol use: Never   Drug use: Never   Sexual activity: Not on file  Other Topics Concern   Not on file  Social History Narrative   Not on file   Social Determinants of Health   Financial Resource Strain: Not on file  Food Insecurity: Not on file  Transportation Needs: Not on file  Physical Activity: Not on file  Stress: Not on file  Social Connections: Not on file    Family History  Problem Relation Age of Onset    Diabetes Mother    Thyroid disease Mother    Hypertension Mother    Hyperlipidemia Mother    Heart attack Mother    Cancer Mother    Hypertension Father    Cancer Father     Outpatient Encounter Medications as of 03/13/2022  Medication Sig   acetaminophen (TYLENOL) 500 MG tablet Take 650 mg by mouth in the morning.   albuterol (VENTOLIN HFA) 108 (90 Base) MCG/ACT inhaler Inhale 2 puffs into the lungs in the morning, at noon, and at bedtime. *May use every 4 hours as needed for wheezing   allopurinol (ZYLOPRIM) 100 MG tablet Take 100 mg by mouth daily.   ALPRAZolam (XANAX) 0.25 MG tablet Take 0.25 mg by mouth 2 (two) times daily as needed for anxiety.   apixaban (ELIQUIS) 5 MG TABS tablet Take 5 mg by mouth 2 (two) times daily.   ARIPiprazole (ABILIFY) 2 MG  tablet Take 2 mg by mouth in the morning.   BD AUTOSHIELD DUO 30G X 5 MM MISC    bisacodyl (DULCOLAX) 10 MG suppository Place 10 mg rectally daily as needed for moderate constipation.   Cholecalciferol (VITAMIN D3) 1.25 MG (50000 UT) CAPS Take 1 capsule by mouth once a week.   cloNIDine (CATAPRES) 0.1 MG tablet Take 0.1 mg by mouth daily as needed.   divalproex (DEPAKOTE) 125 MG DR tablet Take 125 mg by mouth every 12 (twelve) hours.   divalproex (DEPAKOTE) 250 MG DR tablet Take by mouth.   DULoxetine (CYMBALTA) 30 MG capsule Take 30 mg by mouth daily.   DULoxetine (CYMBALTA) 60 MG capsule Take 60 mg by mouth daily.   fenofibrate (TRICOR) 145 MG tablet Take 145 mg by mouth daily.   finasteride (PROSCAR) 5 MG tablet Take 1 tablet (5 mg total) by mouth daily.   furosemide (LASIX) 40 MG tablet Take 40 mg by mouth in the morning.    gabapentin (NEURONTIN) 100 MG capsule Take 100 mg by mouth in the morning and at bedtime.   gabapentin (NEURONTIN) 100 MG capsule Take by mouth.   hydrocortisone 2.5 % cream Apply 1 application topically daily at 6 (six) AM. Sparingly  to face/neck every evening   insulin aspart (NOVOLOG FLEXPEN) 100 UNIT/ML  FlexPen Inject 12-18 Units into the skin 3 (three) times daily before meals. 90-150= 12u, 151-200= 13u, 201-250= 14u, 251-300= 15u, 301-350= 16u, 351-400= 17u, >400= 18u. Notify provider if less than 70 or greater than 400   insulin detemir (LEVEMIR FLEXTOUCH) 100 UNIT/ML FlexPen Inject 80 Units into the skin at bedtime. Prime pen with 2u  prior to each use. Once opened leave at room temp   insulin lispro (HUMALOG) 100 UNIT/ML injection Inject 12 Units into the skin 3 (three) times daily before meals.   isosorbide mononitrate (IMDUR) 30 MG 24 hr tablet Take 30 mg by mouth in the morning.   ketoconazole (NIZORAL) 2 % shampoo Apply 1 application topically 3 (three) times a week. Evern BioSun, Tues, and Thurs   levETIRAcetam (KEPPRA) 500 MG tablet Take 500 mg by mouth 2 (two) times daily.   levothyroxine (SYNTHROID) 125 MCG tablet Take 125 mcg by mouth daily.   lisinopril (ZESTRIL) 10 MG tablet Take 10 mg by mouth daily.   MAGNESIUM OXIDE PO Take 500 mg by mouth 2 (two) times daily.    metFORMIN (GLUCOPHAGE) 500 MG tablet Take 500 mg by mouth 2 (two) times daily with a meal.   mirabegron ER (MYRBETRIQ) 50 MG TB24 tablet Take 1 tablet (50 mg total) by mouth daily.   nitroGLYCERIN (NITROSTAT) 0.4 MG SL tablet Place 0.4 mg under the tongue every 5 (five) minutes as needed for chest pain.   OXYGEN Inhale 2 L into the lungs continuous. At Bedtime   rosuvastatin (CRESTOR) 10 MG tablet Take 10 mg by mouth in the morning.   senna-docusate (SENOKOT-S) 8.6-50 MG tablet Take 2 tablets by mouth 2 (two) times daily.   tamsulosin (FLOMAX) 0.4 MG CAPS capsule Take 0.4 mg by mouth in the morning. *Give 30min after the same meal   No facility-administered encounter medications on file as of 03/13/2022.    ALLERGIES: Allergies  Allergen Reactions   Celery Oil     Unknown-listed on MAR provided by facility   Morphine And Related     Unknown-listed on MAR provided by facility   Sulfa Antibiotics    Propoxyphene  Palpitations    VACCINATION STATUS:  There is no immunization history on file for this patient.  Diabetes He presents for his follow-up diabetic visit. He has type 2 diabetes mellitus. Onset time: He was diagnosed at approximate age of 44 years. His disease course has been stable. There are no hypoglycemic associated symptoms. Pertinent negatives for hypoglycemia include no confusion, headaches, nervousness/anxiousness, pallor, seizures or tremors. Associated symptoms include blurred vision, fatigue and foot paresthesias. Pertinent negatives for diabetes include no chest pain, no polydipsia, no polyphagia, no polyuria, no weakness and no weight loss. There are no hypoglycemic complications. Symptoms are stable. Diabetic complications include a CVA, heart disease, nephropathy and peripheral neuropathy. Risk factors for coronary artery disease include male sex, obesity, sedentary lifestyle, tobacco exposure, family history, diabetes mellitus, hypertension and dyslipidemia. Current diabetic treatment includes intensive insulin program and oral agent (monotherapy). He is compliant with treatment all of the time (with assistance from assisted living facility staff). His weight is stable. He is following a generally unhealthy diet. When asked about meal planning, he reported none. He has not had a previous visit with a dietitian. He never participates in exercise. (He presents today, accompanied by his care attendant from the nursing home, with no meter or logs to review.  He is mainly here today to discuss abnormal labs (am cortisol).  His POCT A1c today is 7.8%, essentially unchanged from previous visit.  ) An ACE inhibitor/angiotensin II receptor blocker is being taken. He sees a podiatrist (Podiatrist comes to ALF periodically).Eye exam is current.  Thyroid Problem Presents for follow-up visit. Onset time: Patient does not recall the circumstance of his diagnosis with hypothyroidism. Symptoms include  fatigue. Patient reports no anxiety, cold intolerance, constipation, depressed mood, diarrhea, heat intolerance, palpitations, tremors, weight gain or weight loss. The symptoms have been stable. Past treatments include levothyroxine (Is currently on levothyroxine 125 mg p.o. daily.). The following procedures have not been performed: radioiodine uptake scan and thyroidectomy. His past medical history is significant for diabetes, heart failure and obesity.  Hypertension This is a chronic problem. The current episode started more than 1 year ago. The problem is unchanged. The problem is controlled. Associated symptoms include blurred vision. Pertinent negatives include no chest pain, headaches, neck pain, palpitations or shortness of breath. Agents associated with hypertension include thyroid hormones. Risk factors for coronary artery disease include diabetes mellitus, male gender, obesity, sedentary lifestyle, smoking/tobacco exposure, family history and dyslipidemia. Past treatments include ACE inhibitors and diuretics. The current treatment provides mild improvement. There are no compliance problems.  Hypertensive end-organ damage includes kidney disease, CAD/MI, CVA and heart failure. Identifiable causes of hypertension include chronic renal disease and a thyroid problem.   Review of systems  Constitutional: + Minimally fluctuating body weight,  current Body mass index is 34.7 kg/m. , + fatigue, no subjective hyperthermia, no subjective hypothermia Eyes: + blurry vision, no xerophthalmia ENT: no sore throat, no nodules palpated in throat, no dysphagia/odynophagia, no hoarseness Cardiovascular: no chest pain, no shortness of breath, no palpitations Respiratory: no cough, no shortness of breath Gastrointestinal: no nausea/vomiting, + diarrhea- reported by care attendant Genitourinary: + urinary incontinence (had foley catheter but pulled it out and refuses to have it replaced- per care  attendant) Musculoskeletal: no muscle/joint aches, wheelchair bound due to previous CVA, disequilibrium, deconditioning Skin: no rashes, + hyperemia to BLE Neurological: no tremors, no numbness, no tingling, no dizziness Psychiatric: no depression, no anxiety    Objective:    BP 126/71   Pulse 61   Ht 5\' 9"  (1.753  m)   BMI 34.70 kg/m   Wt Readings from Last 3 Encounters:  08/07/21 235 lb (106.6 kg)  01/06/21 247 lb (112 kg)  12/31/20 249 lb (112.9 kg)    BP Readings from Last 3 Encounters:  03/13/22 126/71  02/12/22 123/66  01/09/22 102/66    Physical Exam- Limited  Constitutional:  Body mass index is 34.7 kg/m. , not in acute distress, normal state of mind, smells of urine-wearing brief Eyes:  EOMI, no exophthalmos Neck: Supple Cardiovascular: RRR, no murmurs, rubs, or gallops, no edema Respiratory: Adequate breathing efforts, no crackles, rales, rhonchi, or wheezing Musculoskeletal: no gross deformities, WC bound due to previous CVA Skin:  no rashes, + hyperemia to BLE Neurological: no tremor with outstretched hands   CMP ( most recent) CMP     Component Value Date/Time   NA 139 05/27/2021 0000   K 4.2 05/27/2021 0000   CL 98 (A) 05/27/2021 0000   CO2 25 (A) 05/27/2021 0000   GLUCOSE 115 (H) 12/31/2020 0846   BUN 13 12/04/2021 0000   CREATININE 0.9 12/04/2021 0000   CREATININE 0.97 12/31/2020 0846   CALCIUM 9.2 05/27/2021 0000   PROT 6.9 12/31/2020 0846   ALBUMIN 3.9 05/27/2021 0000   AST 10 (A) 05/27/2021 0000   ALT 9 (A) 05/27/2021 0000   ALKPHOS 28 (L) 12/31/2020 0846   BILITOT 0.6 12/31/2020 0846   GFRNONAA >60 12/31/2020 0846   GFRAA 77 11/13/2020 0000     Diabetic Labs (most recent): Lab Results  Component Value Date   HGBA1C 7.8 03/13/2022   HGBA1C 7.7 12/04/2021   HGBA1C 8.0 (A) 09/09/2021     Lab Results  Component Value Date   TSH 1.68 12/04/2021   TSH 2.74 11/13/2020   TSH 1.70 03/20/2020      Assessment & Plan:   1) Type  2 diabetes mellitus without complication, with long-term current use   - John Kirk has currently uncontrolled symptomatic type 2 DM since 81 years of age.   Recent labs reviewed.  He presents today, accompanied by his care attendant from the nursing home, with no meter or logs to review.  He is mainly here today to discuss abnormal labs (am cortisol).  His POCT A1c today is 7.8%, essentially unchanged from previous visit.    - I had a long discussion with him about the progressive nature of diabetes and the pathology behind its complications. -his diabetes is complicated by CVA, obesity/sedentary life and he remains at a high risk for more acute and chronic complications which include CAD, CVA, CKD, retinopathy, and neuropathy. These are all discussed in detail with him.  - Nutritional counseling repeated at each appointment due to patients tendency to fall back in to old habits.  - The patient admits there is a room for improvement in their diet and drink choices. -  Suggestion is made for the patient to avoid simple carbohydrates from their diet including Cakes, Sweet Desserts / Pastries, Ice Cream, Soda (diet and regular), Sweet Tea, Candies, Chips, Cookies, Sweet Pastries, Store Bought Juices, Alcohol in Excess of 1-2 drinks a day, Artificial Sweeteners, Coffee Creamer, and "Sugar-free" Products. This will help patient to have stable blood glucose profile and potentially avoid unintended weight gain.   - I encouraged the patient to switch to unprocessed or minimally processed complex starch and increased protein intake (animal or plant source), fruits, and vegetables.   - Patient is advised to stick to a routine mealtimes to eat 3  meals a day and avoid unnecessary snacks (to snack only to correct hypoglycemia).  - I have approached him with the following individualized plan to manage  his diabetes and patient agrees:   -In light of his chronic glycemic burden, he will need intensive  treatment with basal/bolus insulin in order for him to achieve and maintain control of diabetes to target.  He is resistant to change eating habits, eats late night snacks frequently.  -He is advised to continue his Levemir 80 units SQ nightly and continue Humalog 12-18 units TID with meals if glucose is above 90 and he is eating (Specific instructions on how to titrate insulin dosage based on glucose readings given to patient in writing).  He can also continue Metformin 500 mg po twice daily with meals.  -He is encouraged to continue monitoring glucose 4 times daily, before meals and before bed, and call the clinic if he has readings less than 70 or greater than 300 for 3 tests in a row.  - Specific targets for  A1c;  LDL, HDL,  and Triglycerides were discussed with the patient.  2) Blood Pressure /Hypertension:   his blood pressure is controlled to target.   he is advised to continue his current medications including Lisinopril 10 mg p.o. daily with breakfast and Lasix 40 mg po daily.  3) Lipids/Hyperlipidemia:  His recent lipid panel from 12/04/21 shows controlled LDL of 41 and slightly elevated triglycerides of 161.  He is advised to continue Crestor 10 mg po daily at bedtime.  Side effects and precautions discussed with him.     4)  Weight/Diet:  His Body mass index is 34.7 kg/m.  -   clearly complicating his diabetes care.   he is  a candidate for weight loss. I discussed with him the fact that loss of 5 - 10% of his  current body weight will have the most impact on his diabetes management.  Exercise, and detailed carbohydrates information provided  -  detailed on discharge instructions.  5) Hypothyroidism: -His previsit thyroid function tests are consistent with appropriate hormone replacement.  He is advised to continue Levothyroxine 125 mcg po daily before breakfast.   - We discussed about the correct intake of his thyroid hormone, on empty stomach at fasting, with water, separated by at  least 30 minutes from breakfast and other medications,  and separated by more than 4 hours from calcium, iron, multivitamins, acid reflux medications (PPIs). -Patient is made aware of the fact that thyroid hormone replacement is needed for life, dose to be adjusted by periodic monitoring of thyroid function tests.  6) Chronic Care/Health Maintenance: -he is on ACEI medications and is encouraged to initiate and continue to follow up with Ophthalmology, Dentist, Podiatrist at least yearly or according to recommendations, and advised to stay away from smoking. I have recommended yearly flu vaccine and pneumonia vaccine at least every 5 years; moderate intensity exercise for up to 150 minutes weekly; and  sleep for at least 7 hours a day.  7) Elevated Cortisol level -He was asked to be seen today given recent abnormal am-cortisol levels.  There are no supporting provider notes to review and care attendant is not aware of reason this test was performed.  He does have COPD, on long-term inhaled steroids which may affect this cortisol reading.  I am not overly concerned about adrenal dysfunction at this time.  It would have been beneficial to have provider notes on why test was performed such as certain  specific symptoms he may be experiencing.  We can certainly recheck cortisol levels in the future via blood work but the most beneficial way would to be collect a 24-hr urine cortisol test.  This would be hard for him given his urinary incontinence and lack of foley catheter.  - he is advised to maintain close follow up with Kotturi, Zadie Rhine, MD for primary care needs, as well as his other providers for optimal and coordinated care.      I spent 43 minutes in the care of the patient today including review of labs from CMP, Lipids, Thyroid Function, Hematology (current and previous including abstractions from other facilities); face-to-face time discussing  his blood glucose readings/logs, discussing  hypoglycemia and hyperglycemia episodes and symptoms, medications doses, his options of short and long term treatment based on the latest standards of care / guidelines;  discussion about incorporating lifestyle medicine;  and documenting the encounter.    Please refer to Patient Instructions for Blood Glucose Monitoring and Insulin/Medications Dosing Guide"  in media tab for additional information. Please  also refer to " Patient Self Inventory" in the Media  tab for reviewed elements of pertinent patient history.  Hinton Dyer participated in the discussions, expressed understanding, and voiced agreement with the above plans.  All questions were answered to his satisfaction. he is encouraged to contact clinic should he have any questions or concerns prior to his return visit.  Follow up plan: - Return in about 4 months (around 07/13/2022) for Diabetes F/U with A1c in office, Thyroid follow up, Bring meter and logs, No previsit labs.  Ronny Bacon, [facility] [address] [place], [place] [address] Phone: [phone] Fax: [phone]  03/13/2022, 10:07 AM

## 2022-03-14 DIAGNOSIS — M4322 Fusion of spine, cervical region: Secondary | ICD-10-CM | POA: Diagnosis not present

## 2022-03-14 DIAGNOSIS — M5134 Other intervertebral disc degeneration, thoracic region: Secondary | ICD-10-CM | POA: Diagnosis not present

## 2022-03-14 DIAGNOSIS — M542 Cervicalgia: Secondary | ICD-10-CM | POA: Diagnosis not present

## 2022-03-14 DIAGNOSIS — M546 Pain in thoracic spine: Secondary | ICD-10-CM | POA: Diagnosis not present

## 2022-03-16 DIAGNOSIS — R1311 Dysphagia, oral phase: Secondary | ICD-10-CM | POA: Diagnosis not present

## 2022-03-16 DIAGNOSIS — R296 Repeated falls: Secondary | ICD-10-CM | POA: Diagnosis not present

## 2022-03-16 DIAGNOSIS — Z79899 Other long term (current) drug therapy: Secondary | ICD-10-CM | POA: Diagnosis not present

## 2022-03-16 DIAGNOSIS — I509 Heart failure, unspecified: Secondary | ICD-10-CM | POA: Diagnosis not present

## 2022-03-17 DIAGNOSIS — R52 Pain, unspecified: Secondary | ICD-10-CM | POA: Diagnosis not present

## 2022-03-17 DIAGNOSIS — R296 Repeated falls: Secondary | ICD-10-CM | POA: Diagnosis not present

## 2022-03-17 DIAGNOSIS — R1311 Dysphagia, oral phase: Secondary | ICD-10-CM | POA: Diagnosis not present

## 2022-03-17 DIAGNOSIS — Z043 Encounter for examination and observation following other accident: Secondary | ICD-10-CM | POA: Diagnosis not present

## 2022-03-17 DIAGNOSIS — I509 Heart failure, unspecified: Secondary | ICD-10-CM | POA: Diagnosis not present

## 2022-03-17 DIAGNOSIS — Z79899 Other long term (current) drug therapy: Secondary | ICD-10-CM | POA: Diagnosis not present

## 2022-03-18 DIAGNOSIS — R296 Repeated falls: Secondary | ICD-10-CM | POA: Diagnosis not present

## 2022-03-18 DIAGNOSIS — R1311 Dysphagia, oral phase: Secondary | ICD-10-CM | POA: Diagnosis not present

## 2022-03-18 DIAGNOSIS — I509 Heart failure, unspecified: Secondary | ICD-10-CM | POA: Diagnosis not present

## 2022-03-19 DIAGNOSIS — R1311 Dysphagia, oral phase: Secondary | ICD-10-CM | POA: Diagnosis not present

## 2022-03-19 DIAGNOSIS — I509 Heart failure, unspecified: Secondary | ICD-10-CM | POA: Diagnosis not present

## 2022-03-19 DIAGNOSIS — R296 Repeated falls: Secondary | ICD-10-CM | POA: Diagnosis not present

## 2022-03-20 DIAGNOSIS — R296 Repeated falls: Secondary | ICD-10-CM | POA: Diagnosis not present

## 2022-03-20 DIAGNOSIS — R1311 Dysphagia, oral phase: Secondary | ICD-10-CM | POA: Diagnosis not present

## 2022-03-20 DIAGNOSIS — I509 Heart failure, unspecified: Secondary | ICD-10-CM | POA: Diagnosis not present

## 2022-03-23 DIAGNOSIS — R296 Repeated falls: Secondary | ICD-10-CM | POA: Diagnosis not present

## 2022-03-23 DIAGNOSIS — R1311 Dysphagia, oral phase: Secondary | ICD-10-CM | POA: Diagnosis not present

## 2022-03-23 DIAGNOSIS — I509 Heart failure, unspecified: Secondary | ICD-10-CM | POA: Diagnosis not present

## 2022-03-24 DIAGNOSIS — I509 Heart failure, unspecified: Secondary | ICD-10-CM | POA: Diagnosis not present

## 2022-03-24 DIAGNOSIS — L84 Corns and callosities: Secondary | ICD-10-CM | POA: Diagnosis not present

## 2022-03-24 DIAGNOSIS — B351 Tinea unguium: Secondary | ICD-10-CM | POA: Diagnosis not present

## 2022-03-24 DIAGNOSIS — R296 Repeated falls: Secondary | ICD-10-CM | POA: Diagnosis not present

## 2022-03-24 DIAGNOSIS — M79676 Pain in unspecified toe(s): Secondary | ICD-10-CM | POA: Diagnosis not present

## 2022-03-24 DIAGNOSIS — E1142 Type 2 diabetes mellitus with diabetic polyneuropathy: Secondary | ICD-10-CM | POA: Diagnosis not present

## 2022-03-24 DIAGNOSIS — R1311 Dysphagia, oral phase: Secondary | ICD-10-CM | POA: Diagnosis not present

## 2022-03-25 DIAGNOSIS — I517 Cardiomegaly: Secondary | ICD-10-CM | POA: Diagnosis not present

## 2022-03-25 DIAGNOSIS — E119 Type 2 diabetes mellitus without complications: Secondary | ICD-10-CM | POA: Diagnosis not present

## 2022-03-25 DIAGNOSIS — I509 Heart failure, unspecified: Secondary | ICD-10-CM | POA: Diagnosis not present

## 2022-03-25 DIAGNOSIS — J449 Chronic obstructive pulmonary disease, unspecified: Secondary | ICD-10-CM | POA: Diagnosis not present

## 2022-03-25 DIAGNOSIS — I10 Essential (primary) hypertension: Secondary | ICD-10-CM | POA: Diagnosis not present

## 2022-03-25 DIAGNOSIS — R1311 Dysphagia, oral phase: Secondary | ICD-10-CM | POA: Diagnosis not present

## 2022-03-25 DIAGNOSIS — R296 Repeated falls: Secondary | ICD-10-CM | POA: Diagnosis not present

## 2022-03-25 DIAGNOSIS — Z794 Long term (current) use of insulin: Secondary | ICD-10-CM | POA: Diagnosis not present

## 2022-03-26 DIAGNOSIS — I509 Heart failure, unspecified: Secondary | ICD-10-CM | POA: Diagnosis not present

## 2022-03-26 DIAGNOSIS — R1311 Dysphagia, oral phase: Secondary | ICD-10-CM | POA: Diagnosis not present

## 2022-03-26 DIAGNOSIS — R296 Repeated falls: Secondary | ICD-10-CM | POA: Diagnosis not present

## 2022-03-28 DIAGNOSIS — R296 Repeated falls: Secondary | ICD-10-CM | POA: Diagnosis not present

## 2022-03-28 DIAGNOSIS — I509 Heart failure, unspecified: Secondary | ICD-10-CM | POA: Diagnosis not present

## 2022-03-28 DIAGNOSIS — R1311 Dysphagia, oral phase: Secondary | ICD-10-CM | POA: Diagnosis not present

## 2022-03-29 DIAGNOSIS — R059 Cough, unspecified: Secondary | ICD-10-CM | POA: Diagnosis not present

## 2022-03-29 DIAGNOSIS — R0602 Shortness of breath: Secondary | ICD-10-CM | POA: Diagnosis not present

## 2022-03-30 DIAGNOSIS — I509 Heart failure, unspecified: Secondary | ICD-10-CM | POA: Diagnosis not present

## 2022-03-30 DIAGNOSIS — R058 Other specified cough: Secondary | ICD-10-CM | POA: Diagnosis not present

## 2022-03-30 DIAGNOSIS — Z79899 Other long term (current) drug therapy: Secondary | ICD-10-CM | POA: Diagnosis not present

## 2022-03-30 DIAGNOSIS — R296 Repeated falls: Secondary | ICD-10-CM | POA: Diagnosis not present

## 2022-03-30 DIAGNOSIS — R1311 Dysphagia, oral phase: Secondary | ICD-10-CM | POA: Diagnosis not present

## 2022-03-31 DIAGNOSIS — I509 Heart failure, unspecified: Secondary | ICD-10-CM | POA: Diagnosis not present

## 2022-03-31 DIAGNOSIS — R1311 Dysphagia, oral phase: Secondary | ICD-10-CM | POA: Diagnosis not present

## 2022-03-31 DIAGNOSIS — Z79899 Other long term (current) drug therapy: Secondary | ICD-10-CM | POA: Diagnosis not present

## 2022-03-31 DIAGNOSIS — R296 Repeated falls: Secondary | ICD-10-CM | POA: Diagnosis not present

## 2022-04-01 DIAGNOSIS — R296 Repeated falls: Secondary | ICD-10-CM | POA: Diagnosis not present

## 2022-04-01 DIAGNOSIS — I509 Heart failure, unspecified: Secondary | ICD-10-CM | POA: Diagnosis not present

## 2022-04-01 DIAGNOSIS — R1311 Dysphagia, oral phase: Secondary | ICD-10-CM | POA: Diagnosis not present

## 2022-04-02 DIAGNOSIS — I509 Heart failure, unspecified: Secondary | ICD-10-CM | POA: Diagnosis not present

## 2022-04-02 DIAGNOSIS — R296 Repeated falls: Secondary | ICD-10-CM | POA: Diagnosis not present

## 2022-04-02 DIAGNOSIS — R1311 Dysphagia, oral phase: Secondary | ICD-10-CM | POA: Diagnosis not present

## 2022-04-03 DIAGNOSIS — R296 Repeated falls: Secondary | ICD-10-CM | POA: Diagnosis not present

## 2022-04-03 DIAGNOSIS — R1311 Dysphagia, oral phase: Secondary | ICD-10-CM | POA: Diagnosis not present

## 2022-04-03 DIAGNOSIS — I509 Heart failure, unspecified: Secondary | ICD-10-CM | POA: Diagnosis not present

## 2022-04-06 ENCOUNTER — Ambulatory Visit (INDEPENDENT_AMBULATORY_CARE_PROVIDER_SITE_OTHER): Payer: Medicare Other | Admitting: Gastroenterology

## 2022-04-09 DIAGNOSIS — I499 Cardiac arrhythmia, unspecified: Secondary | ICD-10-CM | POA: Diagnosis not present

## 2022-04-09 DIAGNOSIS — R069 Unspecified abnormalities of breathing: Secondary | ICD-10-CM | POA: Diagnosis not present

## 2022-04-09 DIAGNOSIS — I469 Cardiac arrest, cause unspecified: Secondary | ICD-10-CM | POA: Diagnosis not present

## 2022-04-09 DIAGNOSIS — Z743 Need for continuous supervision: Secondary | ICD-10-CM | POA: Diagnosis not present

## 2022-04-09 DIAGNOSIS — R4020 Unspecified coma: Secondary | ICD-10-CM | POA: Diagnosis not present

## 2022-04-11 DEATH — deceased

## 2022-05-20 ENCOUNTER — Ambulatory Visit: Payer: Medicare Other | Admitting: Nurse Practitioner

## 2022-07-14 ENCOUNTER — Ambulatory Visit: Payer: Medicare Other | Admitting: Nurse Practitioner

## 2022-09-17 ENCOUNTER — Ambulatory Visit: Payer: Medicare Other | Admitting: Urology

## 2023-05-07 IMAGING — DX DG KNEE COMPLETE 4+V*L*
4 series · 4 of 4 positions shown · non-contrast
Comparison: None.

CLINICAL DATA: Fall

EXAM:
LEFT KNEE - COMPLETE 4+ VIEW

[knee ap]
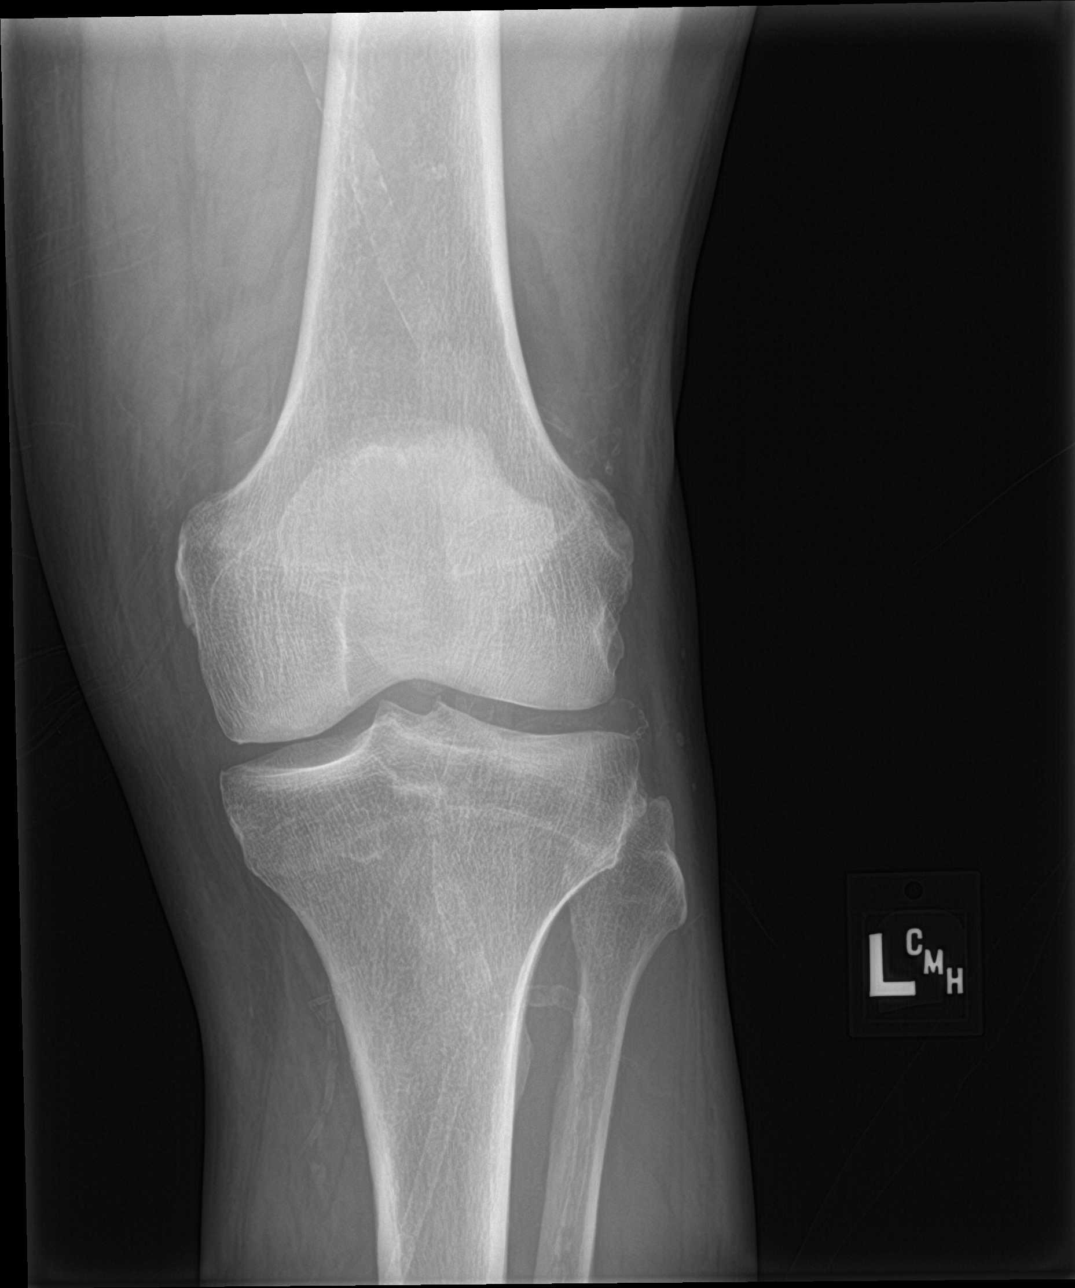

[knee obl (1 of 2)]
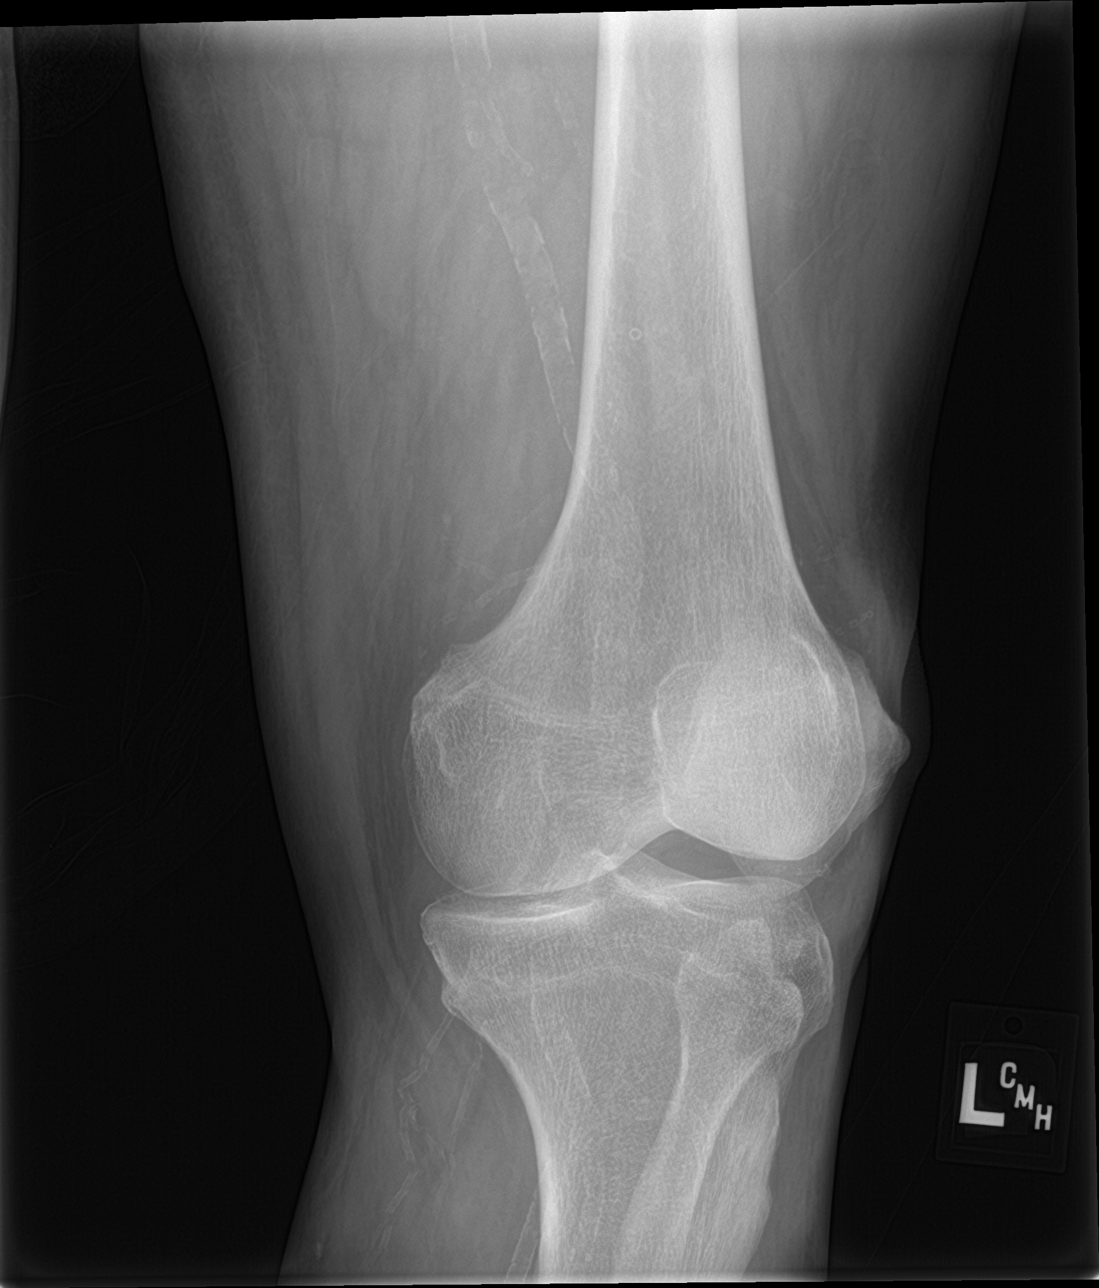

[knee obl (2 of 2)]
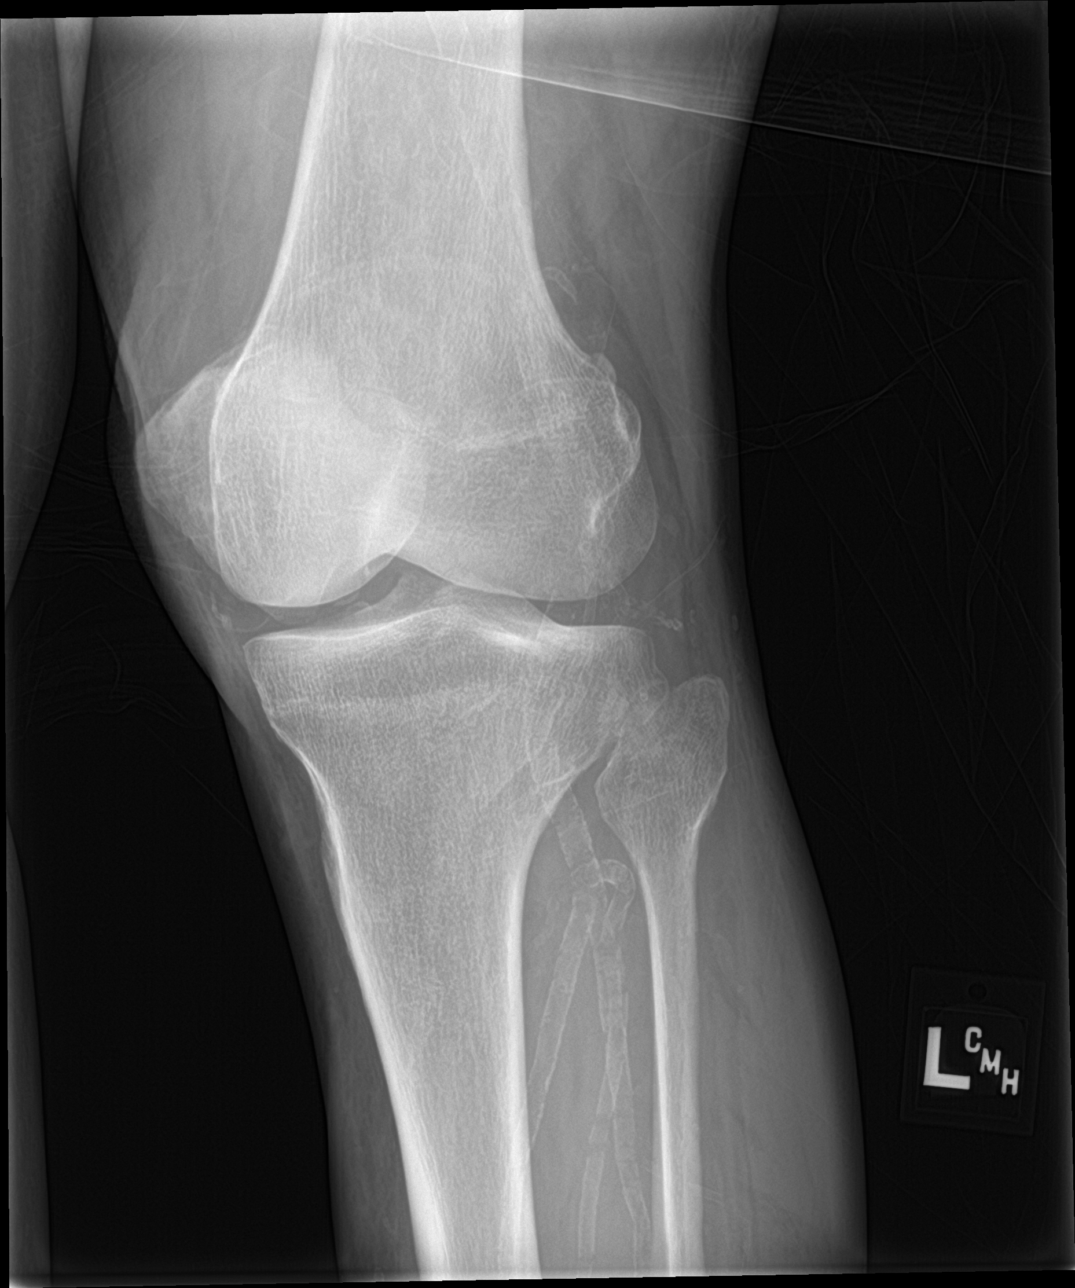

[knee lat]
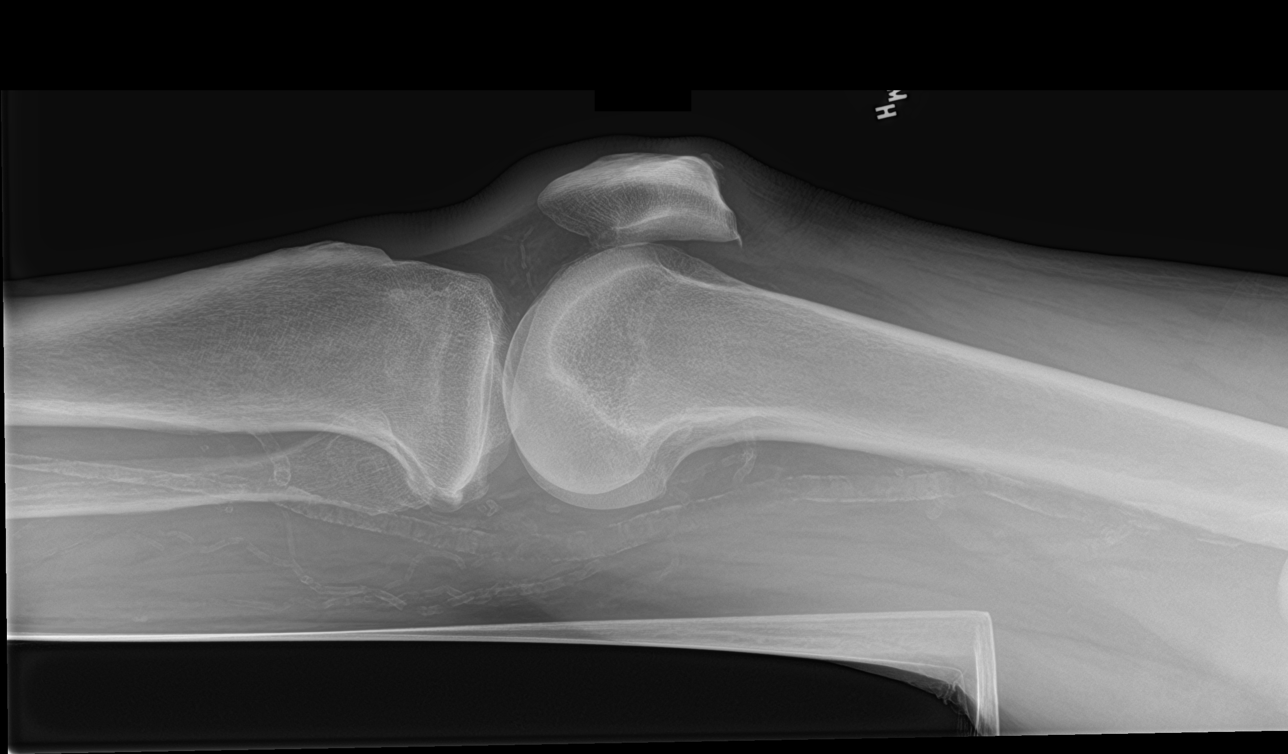

[4 of 4 positions shown; findings below may reference images not displayed]

FINDINGS: No fracture or malalignment. Mild patellofemoral degenerative
change. Vascular calcifications. No significant effusion
IMPRESSION: Mild degenerative change.  No acute osseous abnormality

## 2023-05-07 IMAGING — CT CT CERVICAL SPINE W/O CM
3 of 4 series · 14 of 33 positions shown, 17 images · non-contrast
Comparison: Cervical spine CT dated 08/07/2021.

CLINICAL DATA: Trauma.

EXAM:
CT CERVICAL SPINE WITHOUT CONTRAST
TECHNIQUE: Multidetector CT imaging of the cervical spine was performed without
intravenous contrast. Multiplanar CT image reconstructions were also
generated.

[Series 5: sagittal bone · sagittal · 0.32mm/px · 5 of 61 slices shown, 6 images]
[im 21/61  bone]
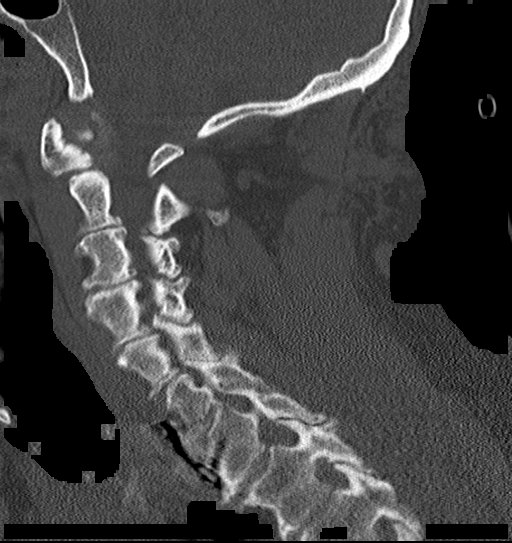
[im 26/61  bone]
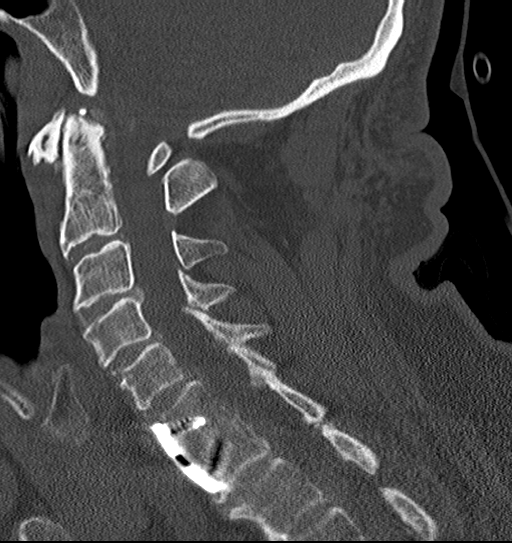
[im 31/61  soft-tissue]
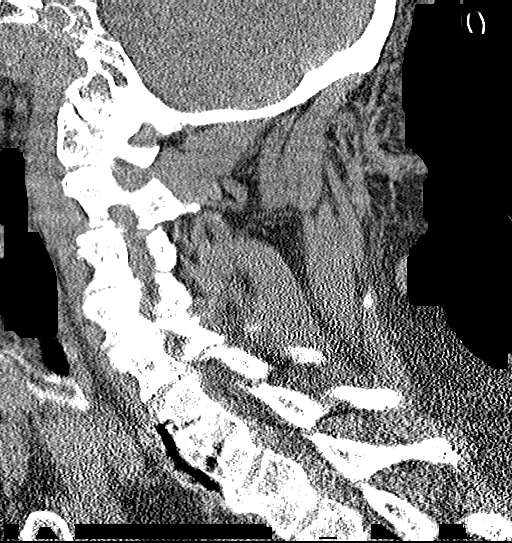
[im 31/61  bone]
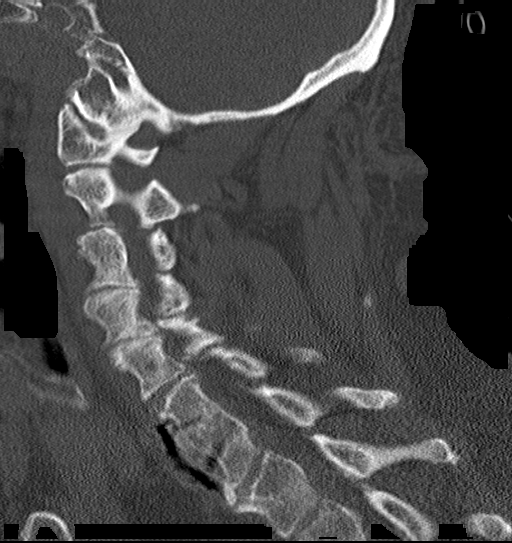
[im 36/61  bone]
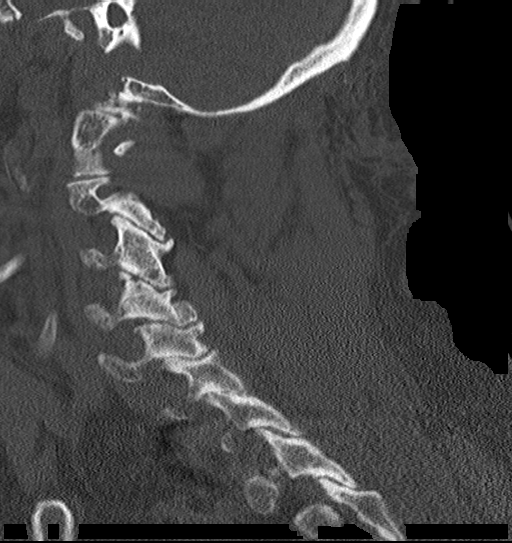
[im 41/61  bone]
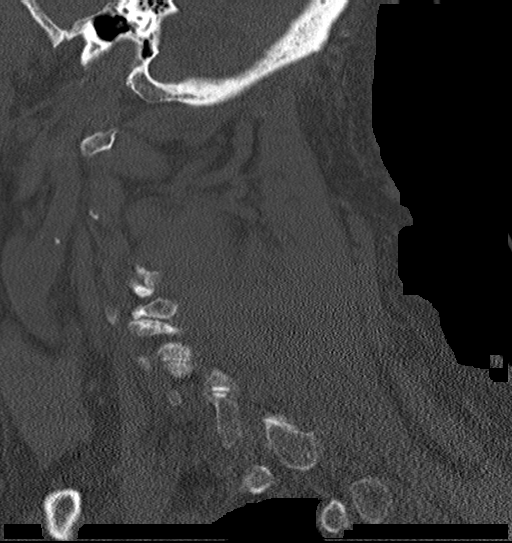

[Series 6: coronal bone · coronal · 0.26mm/px · 3 of 63 slices shown]
[im 14/63  bone]
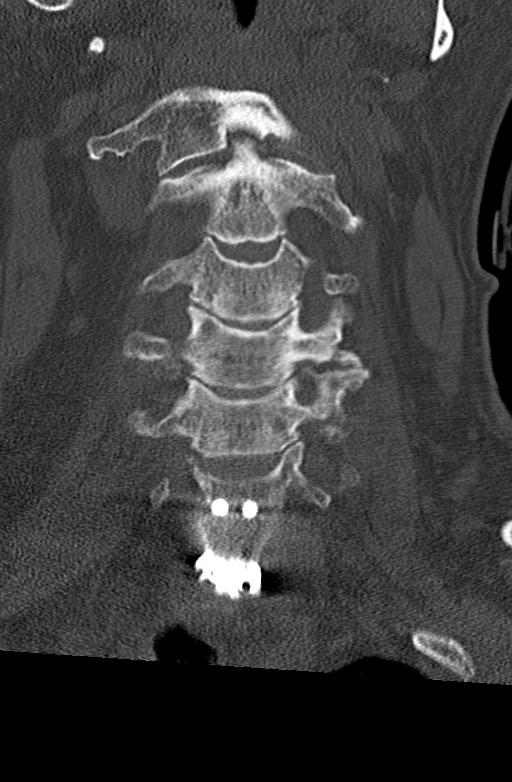
[im 26/63  bone]
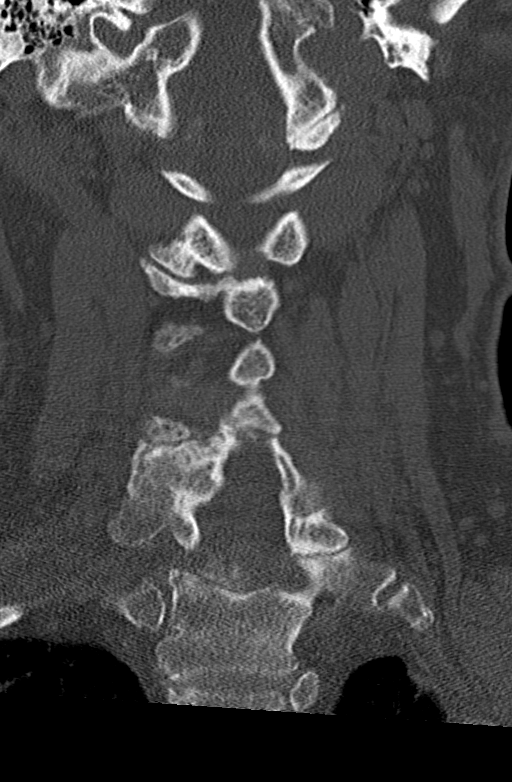
[im 38/63  bone]
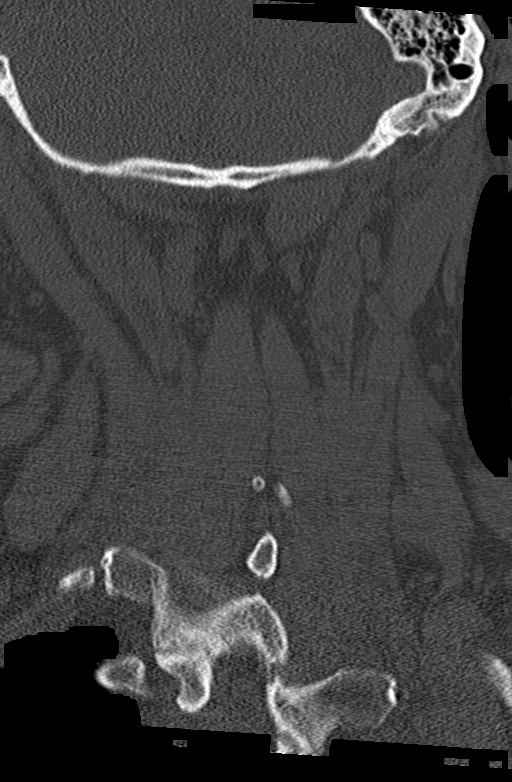

[Series 7: orthogonal axials · axial · 0.21mm/px · z∈[-185,-68]mm · 6 of 120 slices shown, 8 images]
[im 18/120  soft-tissue]
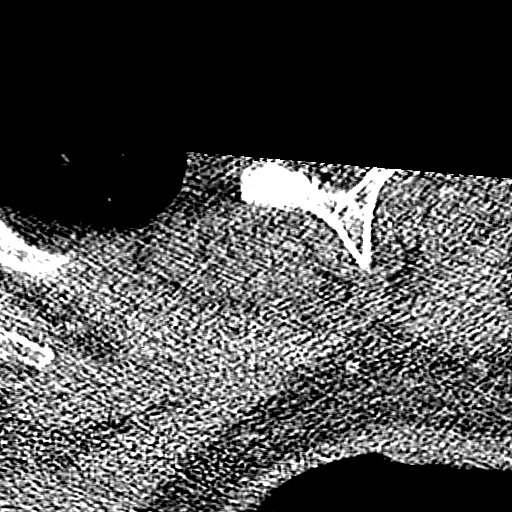
[im 18/120  bone]
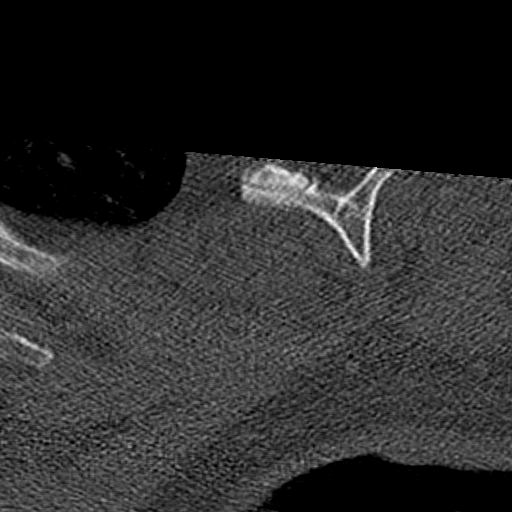
[im 35/120  bone]
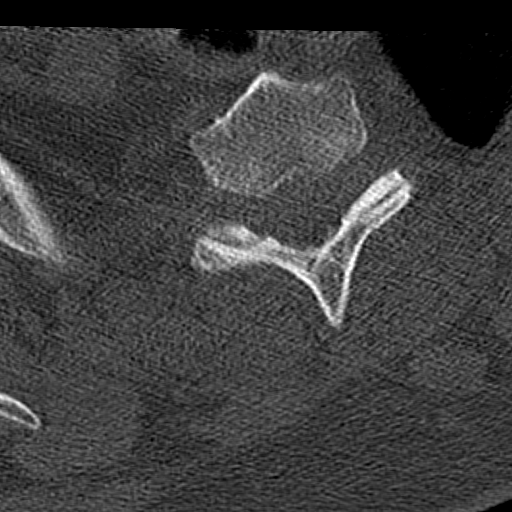
[im 52/120  bone]
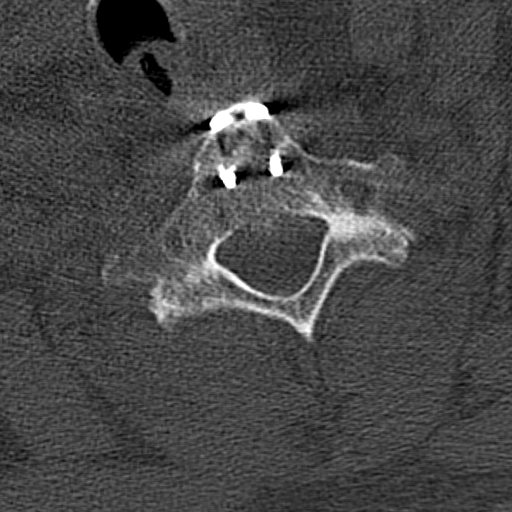
[im 69/120  bone]
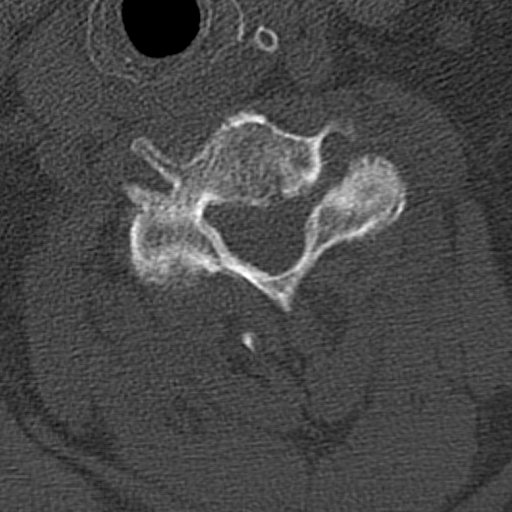
[im 86/120  soft-tissue]
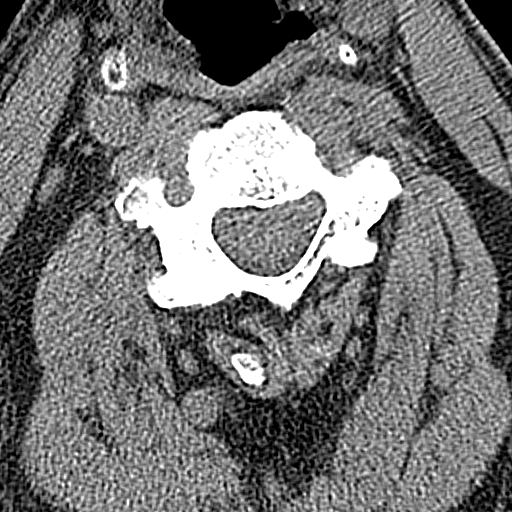
[im 86/120  bone]
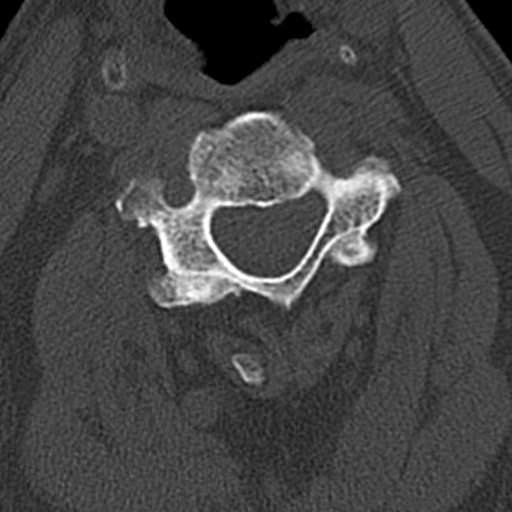
[im 103/120  bone]
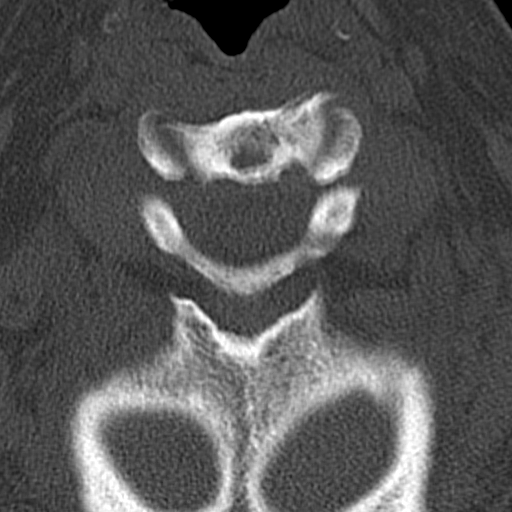

[14 of 33 positions shown; findings below may reference images not displayed]

FINDINGS: Alignment: No acute subluxation.

Skull base and vertebrae: No acute fracture.  Osteopenia.

Soft tissues and spinal canal: No prevertebral fluid or swelling. No
visible canal hematoma.

Disc levels:  Degenerative changes.  C6-C7 ACDF.

Upper chest: Negative.

Other: None
IMPRESSION: 1. No acute/traumatic cervical spine pathology.
2. C6-C7 ACDF.
